# Patient Record
Sex: Male | Born: 1945 | Race: Black or African American | Hispanic: No | Marital: Married | Smoking: Never smoker
Health system: Southern US, Community
[De-identification: ages and names within clinical notes are randomized; demographics above are authoritative.]

## PROBLEM LIST (undated history)

## (undated) DIAGNOSIS — I82409 Acute embolism and thrombosis of unspecified deep veins of unspecified lower extremity: Secondary | ICD-10-CM

## (undated) DIAGNOSIS — I2699 Other pulmonary embolism without acute cor pulmonale: Secondary | ICD-10-CM

## (undated) DIAGNOSIS — I10 Essential (primary) hypertension: Secondary | ICD-10-CM

---

## 2004-04-29 ENCOUNTER — Ambulatory Visit (HOSPITAL_COMMUNITY): Admission: RE | Admit: 2004-04-29 | Discharge: 2004-04-29 | Payer: Self-pay

## 2009-02-24 ENCOUNTER — Ambulatory Visit (HOSPITAL_COMMUNITY): Admission: RE | Admit: 2009-02-24 | Discharge: 2009-02-24 | Payer: Self-pay | Admitting: *Deleted

## 2009-06-28 ENCOUNTER — Encounter (INDEPENDENT_AMBULATORY_CARE_PROVIDER_SITE_OTHER): Payer: Self-pay | Admitting: Internal Medicine

## 2009-06-28 ENCOUNTER — Inpatient Hospital Stay (HOSPITAL_COMMUNITY): Admission: EM | Admit: 2009-06-28 | Discharge: 2009-07-03 | Payer: Self-pay | Admitting: Unknown Physician Specialty

## 2009-06-28 ENCOUNTER — Ambulatory Visit: Payer: Self-pay | Admitting: Vascular Surgery

## 2010-06-01 ENCOUNTER — Ambulatory Visit: Payer: Self-pay | Admitting: Vascular Surgery

## 2010-06-01 ENCOUNTER — Encounter (INDEPENDENT_AMBULATORY_CARE_PROVIDER_SITE_OTHER): Payer: Self-pay | Admitting: Internal Medicine

## 2010-06-01 ENCOUNTER — Inpatient Hospital Stay (HOSPITAL_COMMUNITY): Admission: EM | Admit: 2010-06-01 | Discharge: 2010-06-09 | Payer: Self-pay | Admitting: Emergency Medicine

## 2010-10-10 LAB — HEMOGLOBIN A1C
Hgb A1c MFr Bld: 6.4 % — ABNORMAL HIGH (ref <5.7)
Mean Plasma Glucose: 137 mg/dL — ABNORMAL HIGH (ref <117)

## 2010-10-10 LAB — GLUCOSE, CAPILLARY
Glucose-Capillary: 113 mg/dL — ABNORMAL HIGH (ref 70–99)
Glucose-Capillary: 114 mg/dL — ABNORMAL HIGH (ref 70–99)
Glucose-Capillary: 117 mg/dL — ABNORMAL HIGH (ref 70–99)
Glucose-Capillary: 119 mg/dL — ABNORMAL HIGH (ref 70–99)
Glucose-Capillary: 119 mg/dL — ABNORMAL HIGH (ref 70–99)
Glucose-Capillary: 122 mg/dL — ABNORMAL HIGH (ref 70–99)
Glucose-Capillary: 122 mg/dL — ABNORMAL HIGH (ref 70–99)
Glucose-Capillary: 122 mg/dL — ABNORMAL HIGH (ref 70–99)
Glucose-Capillary: 123 mg/dL — ABNORMAL HIGH (ref 70–99)
Glucose-Capillary: 126 mg/dL — ABNORMAL HIGH (ref 70–99)
Glucose-Capillary: 126 mg/dL — ABNORMAL HIGH (ref 70–99)
Glucose-Capillary: 128 mg/dL — ABNORMAL HIGH (ref 70–99)
Glucose-Capillary: 131 mg/dL — ABNORMAL HIGH (ref 70–99)
Glucose-Capillary: 138 mg/dL — ABNORMAL HIGH (ref 70–99)
Glucose-Capillary: 139 mg/dL — ABNORMAL HIGH (ref 70–99)
Glucose-Capillary: 140 mg/dL — ABNORMAL HIGH (ref 70–99)
Glucose-Capillary: 140 mg/dL — ABNORMAL HIGH (ref 70–99)
Glucose-Capillary: 146 mg/dL — ABNORMAL HIGH (ref 70–99)
Glucose-Capillary: 157 mg/dL — ABNORMAL HIGH (ref 70–99)
Glucose-Capillary: 179 mg/dL — ABNORMAL HIGH (ref 70–99)
Glucose-Capillary: 95 mg/dL (ref 70–99)

## 2010-10-10 LAB — CBC
HCT: 37.9 % — ABNORMAL LOW (ref 39.0–52.0)
HCT: 39 % (ref 39.0–52.0)
HCT: 39.3 % (ref 39.0–52.0)
HCT: 40.4 % (ref 39.0–52.0)
HCT: 41 % (ref 39.0–52.0)
HCT: 42.3 % (ref 39.0–52.0)
Hemoglobin: 12.7 g/dL — ABNORMAL LOW (ref 13.0–17.0)
Hemoglobin: 13 g/dL (ref 13.0–17.0)
Hemoglobin: 13 g/dL (ref 13.0–17.0)
Hemoglobin: 13.4 g/dL (ref 13.0–17.0)
Hemoglobin: 13.5 g/dL (ref 13.0–17.0)
Hemoglobin: 13.8 g/dL (ref 13.0–17.0)
Hemoglobin: 14.2 g/dL (ref 13.0–17.0)
Hemoglobin: 14.9 g/dL (ref 13.0–17.0)
MCH: 28.5 pg (ref 26.0–34.0)
MCH: 28.6 pg (ref 26.0–34.0)
MCH: 28.8 pg (ref 26.0–34.0)
MCH: 28.8 pg (ref 26.0–34.0)
MCH: 28.8 pg (ref 26.0–34.0)
MCH: 29.2 pg (ref 26.0–34.0)
MCHC: 33.5 g/dL (ref 30.0–36.0)
MCHC: 33.7 g/dL (ref 30.0–36.0)
MCHC: 34.2 g/dL (ref 30.0–36.0)
MCHC: 34.4 g/dL (ref 30.0–36.0)
MCHC: 34.6 g/dL (ref 30.0–36.0)
MCHC: 35.2 g/dL (ref 30.0–36.0)
MCV: 84 fL (ref 78.0–100.0)
MCV: 84 fL (ref 78.0–100.0)
MCV: 84.2 fL (ref 78.0–100.0)
MCV: 84.5 fL (ref 78.0–100.0)
MCV: 84.7 fL (ref 78.0–100.0)
MCV: 85 fL (ref 78.0–100.0)
MCV: 85 fL (ref 78.0–100.0)
MCV: 85 fL (ref 78.0–100.0)
Platelets: 219 10*3/uL (ref 150–400)
Platelets: 250 10*3/uL (ref 150–400)
Platelets: 260 10*3/uL (ref 150–400)
RBC: 4.51 MIL/uL (ref 4.22–5.81)
RBC: 4.59 MIL/uL (ref 4.22–5.81)
RDW: 13.9 % (ref 11.5–15.5)
RDW: 14.2 % (ref 11.5–15.5)
RDW: 14.3 % (ref 11.5–15.5)
WBC: 5.7 10*3/uL (ref 4.0–10.5)
WBC: 5.8 10*3/uL (ref 4.0–10.5)

## 2010-10-10 LAB — APTT
aPTT: 110 seconds — ABNORMAL HIGH (ref 24–37)
aPTT: 28 seconds (ref 24–37)

## 2010-10-10 LAB — PROTIME-INR
INR: 1.1 (ref 0.00–1.49)
INR: 1.16 (ref 0.00–1.49)
INR: 2.37 — ABNORMAL HIGH (ref 0.00–1.49)
INR: 2.67 — ABNORMAL HIGH (ref 0.00–1.49)
Prothrombin Time: 15 seconds (ref 11.6–15.2)

## 2010-10-10 LAB — CARDIAC PANEL(CRET KIN+CKTOT+MB+TROPI): Troponin I: 0.2 ng/mL — ABNORMAL HIGH (ref 0.00–0.06)

## 2010-10-10 LAB — HEPARIN LEVEL (UNFRACTIONATED)
Heparin Unfractionated: 0.29 IU/mL — ABNORMAL LOW (ref 0.30–0.70)
Heparin Unfractionated: 0.32 IU/mL (ref 0.30–0.70)
Heparin Unfractionated: 0.57 IU/mL (ref 0.30–0.70)
Heparin Unfractionated: 0.71 IU/mL — ABNORMAL HIGH (ref 0.30–0.70)

## 2010-10-10 LAB — BASIC METABOLIC PANEL
CO2: 25 mEq/L (ref 19–32)
GFR calc non Af Amer: 52 mL/min — ABNORMAL LOW (ref >60)
Glucose, Bld: 154 mg/dL — ABNORMAL HIGH (ref 70–99)
Potassium: 4.3 mEq/L (ref 3.5–5.1)
Sodium: 139 mEq/L (ref 135–145)

## 2010-10-10 LAB — POCT CARDIAC MARKERS
CKMB, poc: 1.4 ng/mL (ref 1.0–8.0)
CKMB, poc: 1.8 ng/mL (ref 1.0–8.0)
Myoglobin, poc: 112 ng/mL (ref 12–200)
Troponin i, poc: 0.14 ng/mL — ABNORMAL HIGH (ref 0.00–0.09)
Troponin i, poc: <0.05 ng/mL (ref 0.00–0.09)

## 2010-10-10 LAB — CK TOTAL AND CKMB (NOT AT ARMC): Total CK: 208 U/L (ref 7–232)

## 2010-10-10 LAB — DIFFERENTIAL
Basophils Relative: 0 % (ref 0–1)
Monocytes Absolute: 0.8 10*3/uL (ref 0.1–1.0)
Monocytes Relative: 11 % (ref 3–12)
Neutro Abs: 4.4 10*3/uL (ref 1.7–7.7)

## 2010-10-10 LAB — URINE CULTURE: Culture  Setup Time: 201111031001

## 2010-10-31 LAB — CBC
HCT: 37.6 % — ABNORMAL LOW (ref 39.0–52.0)
HCT: 38.6 % — ABNORMAL LOW (ref 39.0–52.0)
HCT: 38.7 % — ABNORMAL LOW (ref 39.0–52.0)
HCT: 40.9 % (ref 39.0–52.0)
Hemoglobin: 12.9 g/dL — ABNORMAL LOW (ref 13.0–17.0)
Hemoglobin: 13.1 g/dL (ref 13.0–17.0)
MCHC: 33.8 g/dL (ref 30.0–36.0)
MCHC: 34 g/dL (ref 30.0–36.0)
MCV: 89.1 fL (ref 78.0–100.0)
Platelets: 284 10*3/uL (ref 150–400)
Platelets: 324 10*3/uL (ref 150–400)
RBC: 4.35 MIL/uL (ref 4.22–5.81)
RDW: 14.1 % (ref 11.5–15.5)
RDW: 14.6 % (ref 11.5–15.5)
RDW: 14.6 % (ref 11.5–15.5)
RDW: 14.9 % (ref 11.5–15.5)
WBC: 6.5 10*3/uL (ref 4.0–10.5)

## 2010-10-31 LAB — PROTEIN S ACTIVITY: Protein S Activity: 87 % (ref 69–129)

## 2010-10-31 LAB — BASIC METABOLIC PANEL
BUN: 9 mg/dL (ref 6–23)
CO2: 28 mEq/L (ref 19–32)
Calcium: 9.2 mg/dL (ref 8.4–10.5)
Creatinine, Ser: 1.22 mg/dL (ref 0.4–1.5)
GFR calc non Af Amer: >60 mL/min (ref >60)
Glucose, Bld: 89 mg/dL (ref 70–99)

## 2010-10-31 LAB — ANTIPHOSPHOLIPID SYNDROME EVAL, BLD
Anticardiolipin IgA: <10 APL U/mL — ABNORMAL LOW (ref <10)
Anticardiolipin IgM: <3 MPL U/mL — ABNORMAL LOW (ref <10)
DRVVT: 45.9 secs — ABNORMAL HIGH (ref 34.7–40.5)
PTT Lupus Anticoagulant: 53.8 secs — ABNORMAL HIGH (ref 32.0–43.4)
PTTLA Confirmation: 0 secs (ref <8.0)

## 2010-10-31 LAB — PROTEIN C, TOTAL: Protein C, Total: 96 % (ref 70–140)

## 2010-10-31 LAB — CARDIAC PANEL(CRET KIN+CKTOT+MB+TROPI)
CK, MB: 0.9 ng/mL (ref 0.3–4.0)
Relative Index: 1.1 (ref 0.0–2.5)
Total CK: 97 U/L (ref 7–232)

## 2010-10-31 LAB — PROTIME-INR
INR: 1.09 (ref 0.00–1.49)
INR: 1.22 (ref 0.00–1.49)
Prothrombin Time: 14 seconds (ref 11.6–15.2)
Prothrombin Time: 15.3 seconds — ABNORMAL HIGH (ref 11.6–15.2)
Prothrombin Time: 18.9 seconds — ABNORMAL HIGH (ref 11.6–15.2)

## 2010-10-31 LAB — COMPREHENSIVE METABOLIC PANEL
Alkaline Phosphatase: 55 U/L (ref 39–117)
BUN: 10 mg/dL (ref 6–23)
Chloride: 107 mEq/L (ref 96–112)
Glucose, Bld: 103 mg/dL — ABNORMAL HIGH (ref 70–99)
Potassium: 4.1 mEq/L (ref 3.5–5.1)
Total Bilirubin: 0.3 mg/dL (ref 0.3–1.2)
Total Protein: 6.1 g/dL (ref 6.0–8.3)

## 2010-10-31 LAB — GLUCOSE, CAPILLARY: Glucose-Capillary: 141 mg/dL — ABNORMAL HIGH (ref 70–99)

## 2010-10-31 LAB — PROTEIN S, TOTAL: Protein S Ag, Total: 112 % (ref 70–140)

## 2010-10-31 LAB — ANTITHROMBIN III: AntiThromb III Func: 86 % (ref 76–126)

## 2010-11-01 LAB — DIFFERENTIAL
Basophils Absolute: 0 10*3/uL (ref 0.0–0.1)
Basophils Relative: 0 % (ref 0–1)
Eosinophils Absolute: 0.3 10*3/uL (ref 0.0–0.7)
Eosinophils Relative: 5 % (ref 0–5)
Monocytes Absolute: 0.6 10*3/uL (ref 0.1–1.0)
Neutro Abs: 3.4 10*3/uL (ref 1.7–7.7)

## 2010-11-01 LAB — CBC
Hemoglobin: 12.3 g/dL — ABNORMAL LOW (ref 13.0–17.0)
MCHC: 33.8 g/dL (ref 30.0–36.0)
RDW: 14.7 % (ref 11.5–15.5)

## 2010-11-01 LAB — POCT I-STAT, CHEM 8
BUN: 10 mg/dL (ref 6–23)
Calcium, Ion: 1.16 mmol/L (ref 1.12–1.32)
Creatinine, Ser: 1.3 mg/dL (ref 0.4–1.5)
Glucose, Bld: 86 mg/dL (ref 70–99)
TCO2: 22 mmol/L (ref 0–100)

## 2010-11-25 ENCOUNTER — Emergency Department (HOSPITAL_COMMUNITY): Payer: BC Managed Care – PPO

## 2010-11-25 ENCOUNTER — Observation Stay (HOSPITAL_COMMUNITY)
Admission: EM | Admit: 2010-11-25 | Discharge: 2010-11-26 | Disposition: A | Discharge status: Home / Self Care | Payer: BC Managed Care – PPO | Attending: Internal Medicine | Admitting: Internal Medicine

## 2010-11-25 ENCOUNTER — Encounter (HOSPITAL_COMMUNITY): Payer: Self-pay | Admitting: Radiology

## 2010-11-25 DIAGNOSIS — I129 Hypertensive chronic kidney disease with stage 1 through stage 4 chronic kidney disease, or unspecified chronic kidney disease: Secondary | ICD-10-CM | POA: Insufficient documentation

## 2010-11-25 DIAGNOSIS — R0789 Other chest pain: Principal | ICD-10-CM | POA: Insufficient documentation

## 2010-11-25 DIAGNOSIS — Z86718 Personal history of other venous thrombosis and embolism: Secondary | ICD-10-CM | POA: Insufficient documentation

## 2010-11-25 DIAGNOSIS — E119 Type 2 diabetes mellitus without complications: Secondary | ICD-10-CM | POA: Insufficient documentation

## 2010-11-25 DIAGNOSIS — Z7901 Long term (current) use of anticoagulants: Secondary | ICD-10-CM | POA: Insufficient documentation

## 2010-11-25 DIAGNOSIS — N182 Chronic kidney disease, stage 2 (mild): Secondary | ICD-10-CM | POA: Insufficient documentation

## 2010-11-25 DIAGNOSIS — E785 Hyperlipidemia, unspecified: Secondary | ICD-10-CM | POA: Insufficient documentation

## 2010-11-25 LAB — COMPREHENSIVE METABOLIC PANEL
ALT: 31 U/L (ref 0–53)
AST: 21 U/L (ref 0–37)
Alkaline Phosphatase: 52 U/L (ref 39–117)
CO2: 24 mEq/L (ref 19–32)
GFR calc Af Amer: >60 mL/min (ref >60)
Glucose, Bld: 132 mg/dL — ABNORMAL HIGH (ref 70–99)
Potassium: 4 mEq/L (ref 3.5–5.1)
Sodium: 133 mEq/L — ABNORMAL LOW (ref 135–145)
Total Protein: 6.4 g/dL (ref 6.0–8.3)

## 2010-11-25 LAB — DIFFERENTIAL
Basophils Relative: 0 % (ref 0–1)
Eosinophils Absolute: 0.1 10*3/uL (ref 0.0–0.7)
Monocytes Absolute: 0.5 10*3/uL (ref 0.1–1.0)
Monocytes Relative: 10 % (ref 3–12)

## 2010-11-25 LAB — POCT CARDIAC MARKERS
CKMB, poc: 1.4 ng/mL (ref 1.0–8.0)
Troponin i, poc: <0.05 ng/mL (ref 0.00–0.09)

## 2010-11-25 LAB — CBC
MCH: 28.8 pg (ref 26.0–34.0)
MCHC: 34.8 g/dL (ref 30.0–36.0)
MCV: 82.9 fL (ref 78.0–100.0)
Platelets: 208 10*3/uL (ref 150–400)

## 2010-11-25 LAB — PROTIME-INR: Prothrombin Time: 23.7 seconds — ABNORMAL HIGH (ref 11.6–15.2)

## 2010-11-25 MED ORDER — IOHEXOL 300 MG/ML  SOLN
80.0000 mL | Freq: Once | INTRAMUSCULAR | Status: AC | PRN
  Administered 2010-11-25: 80 mL via INTRAVENOUS

## 2010-11-26 LAB — LIPID PANEL
Cholesterol: 115 mg/dL (ref 0–200)
HDL: 38 mg/dL — ABNORMAL LOW (ref >39)
LDL Cholesterol: 66 mg/dL (ref 0–99)
Total CHOL/HDL Ratio: 3 ratio
Triglycerides: 56 mg/dL (ref <150)
VLDL: 11 mg/dL (ref 0–40)

## 2010-11-26 LAB — HEMOGLOBIN A1C
Hgb A1c MFr Bld: 6.7 % — ABNORMAL HIGH (ref <5.7)
Mean Plasma Glucose: 146 mg/dL — ABNORMAL HIGH (ref <117)

## 2010-11-26 LAB — CARDIAC PANEL(CRET KIN+CKTOT+MB+TROPI)
CK, MB: 1.6 ng/mL (ref 0.3–4.0)
CK, MB: 1.5 ng/mL (ref 0.3–4.0)
Relative Index: 0.8 (ref 0.0–2.5)
Relative Index: 0.8 (ref 0.0–2.5)
Total CK: 210 U/L (ref 7–232)
Total CK: 178 U/L (ref 7–232)
Troponin I: 0.01 ng/mL (ref 0.00–0.06)
Troponin I: 0.01 ng/mL (ref 0.00–0.06)

## 2010-11-26 LAB — GLUCOSE, CAPILLARY
Glucose-Capillary: 170 mg/dL — ABNORMAL HIGH (ref 70–99)
Glucose-Capillary: 115 mg/dL — ABNORMAL HIGH (ref 70–99)

## 2010-11-26 LAB — BRAIN NATRIURETIC PEPTIDE: Pro B Natriuretic peptide (BNP): <30 pg/mL (ref 0.0–100.0)

## 2010-11-26 LAB — CK TOTAL AND CKMB (NOT AT ARMC)
CK, MB: 1.8 ng/mL (ref 0.3–4.0)
Relative Index: 0.8 (ref 0.0–2.5)

## 2010-11-26 LAB — TSH: TSH: 1.855 u[IU]/mL (ref 0.350–4.500)

## 2010-11-26 LAB — TROPONIN I: Troponin I: 0.01 ng/mL (ref 0.00–0.06)

## 2010-11-30 NOTE — Discharge Summary (Signed)
NAMESTONY, Chris Burgess             ACCOUNT NO.:  [id number]  MEDICAL RECORD NO.:  [id number]           PATIENT TYPE:  I  LOCATION:  3742                         FACILITY:  [facility]  PHYSICIAN:  Jeoffrey Massed, MD    DATE OF BIRTH:  12-29-1945  DATE OF ADMISSION:  11/25/2010 DATE OF DISCHARGE:                        DISCHARGE SUMMARY - REFERRING   PRIMARY CARE PRACTITIONER:  Juline Patch, M.D.  PRIMARY CARDIOLOGIST:  Dr. Herbie Burgess from [facility].  PRIMARY DISCHARGE DIAGNOSIS:  Atypical chest pain.  SECONDARY DISCHARGE DIAGNOSES: 1. History of recurrent venous thromboembolism, secondary to     hypercoagulable state, requiring a lifelong anticoagulation. 2. Type 2 diabetes. 3. Hypertension. 4. Dyslipidemia.  DISCHARGE MEDICATIONS: 1. Aspirin 81 mg 1 tablet daily. 2. Allopurinol 300 mg 1 tablet p.o. daily. 3. Amlodipine 10 mg 1 tablet p.o. daily. 4. Coumadin 7.5 mg one and half tablets on Mondays, Wednesdays, and     Fridays and 1 tablet on Tuesdays, Thursday, Saturdays, and Sundays. 5. Diovan 320 mg 1 tablet p.o. daily. 6. Lipitor 20 mg 1 tablet p.o. daily. 7. Metformin 750 mg 1 tablet p.o. daily. 8. Nifedipine XR 60 mg 1 tablet daily. 9. Protonix 40 mg 1 tablet p.o. daily.  CONSULTATIONS:  Dr. Royann Shivers from [facility].  BRIEF HISTORY OF PRESENT ILLNESS:  The patient is a very pleasant 65- year-old black male with a history of recurrent venous thromboembolism, secondary to underlying hypercoagulable state, on lifelong anticoagulation with Coumadin, who came into the ED with pleuritic chest pain.  He was then admitted to the hospitalist service.  For further details, please see the history and physical that was dictated by Dr. Wolfgang Burgess on admission.  PERTINENT LABORATORY DATA: 1. Cardiac enzymes were cycled and these were negative. 2. INR on admission was 2.10. 3. BNP was less than 30. 4. HbA1c is 6.7. 5. TSH is  1.855.  RADIOLOGICAL STUDIES:  CT angiogram of the chest done on November 25, 2010 showed no recurrent pulmonary embolism.  No residual sequelae of previous embolic event.  No active process evident.  BRIEF HOSPITAL COURSE: 1. Chest pain.  This was pleuritic in nature and located along the     left side of his chest.  Because of a significant risk factors and     his history of pulmonary embolism in the past, the patient was     admitted to the hospitalist service.  Cardiac enzymes were cycled.     These were negative.  The patient's INR was therapeutic.  However,     given his underlying hypercoagulable state, a repeat CT angiogram     of his chest was done which did not show any new pulmonary     embolism.  The patient does follow up as an outpatient with Dr.     Herbie Burgess from [facility].  As a result,     [facility] were consulted, and they felt that     this patient is stable enough to be discharged home, and their     recommendations are to do an outpatient nuclear stress test.  They  have also recommended that the patient follow up with Dr. Herbie Burgess.     They also note that their office will call the patient to schedule     the stress test as well as a follow-up appointment with Dr. Herbie Burgess     on Monday.  They also recommend that the patient's INR be kept     between 2.5 to 3.5 as well.  As a result, the patient will get an     extra 5 mg of Coumadin today, and he will continue on his usual     dosing of Coumadin, and he will follow up with his primary care     practitioner on March 02 for an INR check.  Again, it is to be     noted that cardiology is recommending that his INR be between 2.5     to 3.5.  Rest of his chronic medical issues were stable.  DISPOSITION:  The patient will be discharged home.  FOLLOW-UP INSTRUCTIONS: 1. The patient to follow up with his primary care practitioner within     1 week upon discharge.  He is to  call for an appointment. 2. The patient has a previously scheduled appointment for an INR     checked on May 02.  He is to keep that appointment, please note     that Cardiology recommendations are to keep the INR between 2.5 to     3.5. 3. [facility] will contact this patient regarding     scheduling a nuclear stress test as an outpatient and to follow up     with Dr. Herbie Burgess.  Total time spent coordinating discharge is 45 minutes.     Jeoffrey Massed, MD     SG/MEDQ  D:  11/26/2010  T:  11/26/2010  Job:  [id number]  cc:   Juline Patch, M.D. [name], MD, MS  Electronically Signed by Jeoffrey Massed  on 11/30/2010 03:40:34 PM

## 2010-12-12 NOTE — Op Note (Signed)
NAMENOAL, ABSHIER             ACCOUNT NO.:  [id number]   MEDICAL RECORD NO.:  [id number]          PATIENT TYPE:  AMB   LOCATION:  ENDO                         FACILITY:  [facility]   PHYSICIAN:  Georgiana Spinner, M.D.    DATE OF BIRTH:  [birth year]   DATE OF PROCEDURE:  DATE OF DISCHARGE:                               OPERATIVE REPORT   PROCEDURE:  Colonoscopy.   INDICATIONS:  Colon cancer screening.   ANESTHESIA:  Fentanyl 75 mcg, Versed 6 mg.   PROCEDURE:  With the patient mildly sedated in the left lateral  decubitus position the Pentax videoscopic colonoscope was inserted in  the rectum.  After a rectal exam was attempted and passed under direct  vision to the cecum, identified by ileocecal valve and appendiceal  orifice there was a pill lodged in the appendiceal orifice, which I  washed away and saw underneath to be clear.  From this point, the  colonoscope was slowly withdrawn taking circumferential views of the  colonic mucosa stopping in the rectum which appeared normal on direct  and showed hemorrhoidal-type tissue on retroflexed view.  The endoscope  was straightened and withdrawn.  The patient's vital signs and pulse  oximeter remained stable.  The patient tolerated the procedure well  without apparent complications.   FINDINGS:  Hemorrhoidal-type tissue.  Otherwise, unremarkable  examination.   PLAN:  Repeat examination 5-10 years as appropriate.           ______________________________  Georgiana Spinner, M.D.     GMO/MEDQ  D:  02/24/2009  T:  02/24/2009  Job:  [id number]

## 2010-12-17 NOTE — H&P (Signed)
Chris Burgess, Chris Burgess             ACCOUNT NO.:  [id number]  MEDICAL RECORD NO.:  [id number]           PATIENT TYPE:  I  LOCATION:  3742                         FACILITY:  [facility]  PHYSICIAN:  Celso Amy, MD   DATE OF BIRTH:  1946/01/14  DATE OF ADMISSION:  11/25/2010 DATE OF DISCHARGE:                             HISTORY & PHYSICAL   CHIEF COMPLAINT:  Chest pain.  HISTORY OF PRESENT ILLNESS:  The patient is a 65 year old African American male with a past medical history of PE, who presented to the ER with a chief complaint of chest pain.  History of present illness dates back to this evening when the patient had onset of chest pain on left side.  Pain was sharp in feeling, it was progressively getting worse and that is the reason the patient presented to the ER.  The patient states the pain gets worse on deep inspiration.  There is no pain when the patient palpates over the area.  It does not increase with exertion. The patient has no complaint of nausea or diaphoresis.  At this time of H and P, the patient does not have any chest pain and is not able to tell that what makes the pain better.  No complaint of dyspnea.  No complaint of bright red blood per rectum.  No complaint of hematemesis or melena.  No complaint of syncope.  No complaint of fever, cough, or chills.  No complaint of recent travel.  The patient also said that he was working in the yard a week ago and was doing more labor than his normal baseline physical activity.  REVIEW OF SYSTEMS:  Positive for tiredness.  ALLERGIES:  The patient has no known drug allergies.  SOCIAL HISTORY:  Nonsmoker and drinks occasionally.  Denies illegal drug abuse.  PAST MEDICAL HISTORY:  Positive for: 1. PE. 2. DVT. 3. The patient is on lifelong Coumadin, has history of 2 Pes. 4. Diabetes mellitus type 2. 5. Hypertension. 6. GERD. 7. Dyslipidemia.  OUTPATIENT MEDICATIONS:  Outpatient medications the patient is on: 1.  Diovan 320 mg p.o. daily. 2. Protonix 40 mg p.o. daily. 3. Coumadin 7.5 mg p.o. daily, the patient takes 1-1/2 tablets on     Monday, Wednesday, and Friday and takes 1 tablet on Tuesday,     Thursday, and Sunday. 4. Lipitor 20 mg p.o. daily. 5. Nifedipine XR 60 mg p.o. daily. 6. Amlodipine 10 mg p.o. daily. 7. Allopurinol 300 mg p.o. daily. 8. Metformin XR 750 mg p.o. daily.  PHYSICAL EXAMINATION:  VITAL SIGNS:  Blood pressure 122/74, pulse 80, respiratory rate 30, temperature is afebrile. GENERAL:  The patient is awake, alert; oriented to time, place, and person; well nourished, lying comfortably. HEENT:  Pupils equally reactive to light and accommodation.  Extraocular movement intact. NECK:  Supple. RESPIRATORY:  No acute respiratory distress. CHEST:  Clear to auscultation bilaterally.  No pain on palpation to the chest pain area. CARDIOVASCULAR:  S1 and S2 is regular rate and rhythm.  No murmurs appreciated. GI:  Obese.  Bowel sounds present, soft, nontender, nondistended. EXTREMITIES:  No lower extremity edema on the right leg,  but there is 1+ edema on the left lower extremity.  This is the same place where the patient has had DVT in the past. CNS:  Cranial nerves II through XII grossly intact.  Motor strength 5/5 in both upper and lower extremities.  No focal motor neurological deficits.  LABORATORY DATA:  Sodium 133, potassium 4.0, serum chloride 104, bicarb 24, BUN 16, serum creatinine 1.3, glucose 132.  AST 21, ALT 31, albumin is 3.7.  The patient's INR is 2.1.  The patient's hemoglobin 12.8, platelet 208.  Troponin 0.05, myoglobin 97.  EKG shows normal sinus rhythm.  No ST-T changes were noted.  The patient had a CT angio which did not show any PE and did not show any acute process.  IMPRESSION: 1. Cardiovascular.  The patient is being admitted with chest pain.     Chest pain is atypical, but the patient has risk factor of being     male, having diabetes and history  of hypertension and dyslipidemia-waarnts further investigation.  2. Respiratory.  The patient has history of pulmonary embolism, is on     Coumadin.  3. Diabetes mellitus type 2.  The patient has history of diabetes     mellitus 2, is on metformin, we will hold for now as the patient     had a CT angio.  4. Hypertension.  The patient's blood pressure is stable.  Out of his     outpatient medications, we will hold amlodipine as the patient is     on both amlodipine and nifedipine.  5. Dyslipidemia.  We will continue outpatient medication.  6. Renal.  The patient has chronic kidney disease stage II and his     creatinine is 1.3.  PLAN: 1. We will admit to telemetry. 2. We will cycle troponins. 3. The patient has atypical chest pain with normal EKG, normal cardiac     enzymes, and on Coumadin.  So, we will not start heparin, but we     will do give aspirin for now. 4. We will continue outpatient medications besides metformin and     amlodipine. 5. We will get a 2-D echo. 6. The patient's further course depends upon how the patient does     during this admission.     Celso Amy, MD    MB/MEDQ  D:  11/25/2010  T:  11/26/2010  Job:  [id number]  Electronically Signed by Celso Amy M.D. on 12/17/2010 02:16:50 PM

## 2012-11-11 IMAGING — CT CT ANGIO CHEST
2 of 6 series · 19 of 36 positions shown · IV contrast (agent unspecified)
Comparison: Chest radiography same day.  Previous CT 06/01/2010

CLINICAL DATA: Left-sided chest pain with inspiration.  History of
DVT and pulmonary emboli.  The patient anticoagulated.

CT ANGIOGRAPHY CHEST WITH CONTRAST
TECHNIQUE: Multidetector CT imaging of the chest was performed
using the standard protocol during bolus administration of
intravenous contrast.  Multiplanar CT image reconstructions
including MIPs were obtained to evaluate the vascular anatomy.
Contrast:  80 ml Amnipaque-CPP

[Series 4: pe · axial · 0.67mm/px · z∈[-226,-18]mm · 18 of 114 slices shown]
[im 5/114  lung]
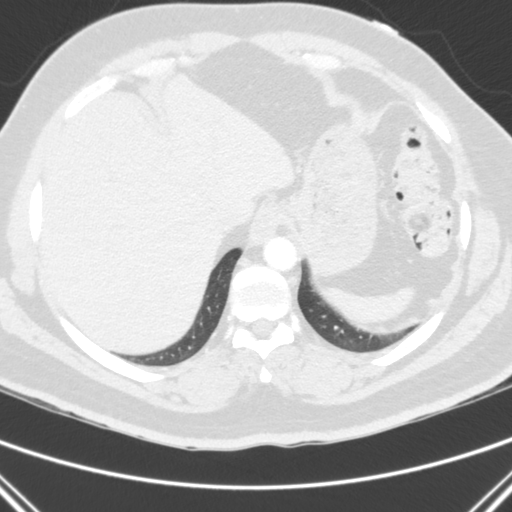
[im 14/114  mediastinal]
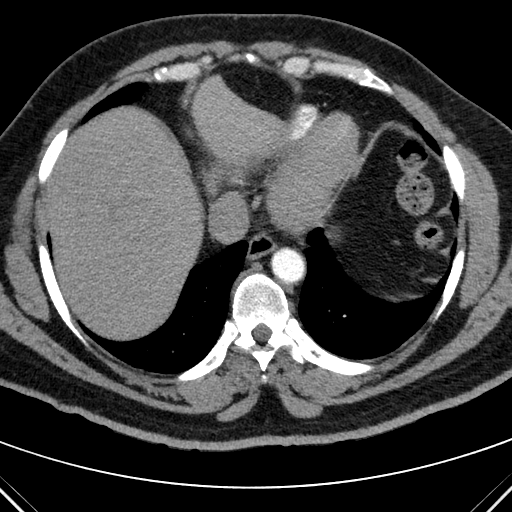
[im 18/114  lung]
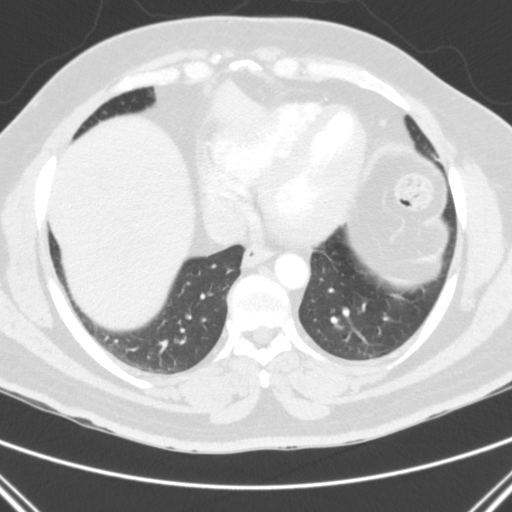
[im 22/114  mediastinal]
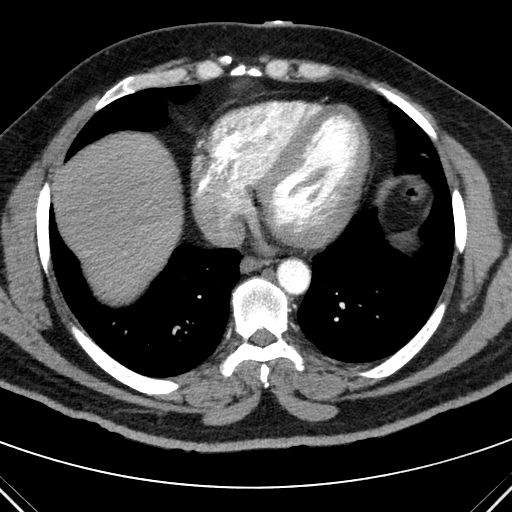
[im 31/114  lung]
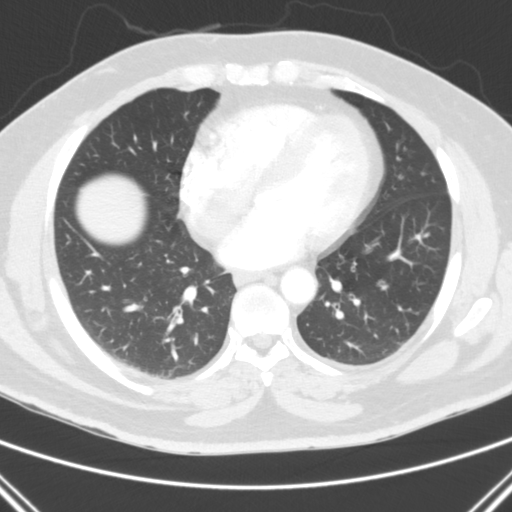
[im 35/114  mediastinal]
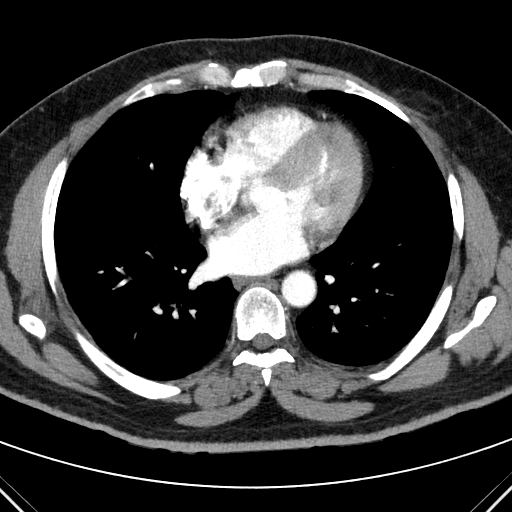
[im 44/114  lung]
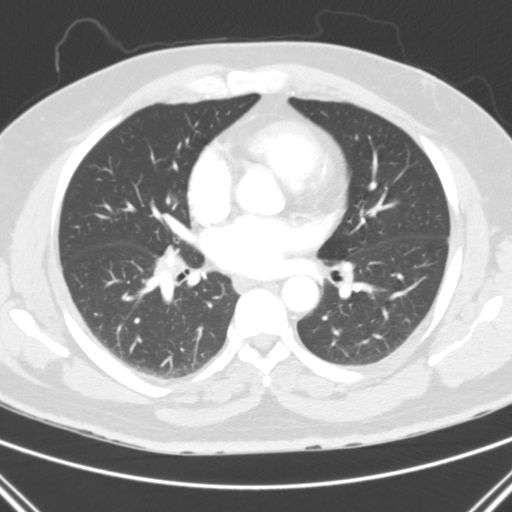
[im 48/114  mediastinal]
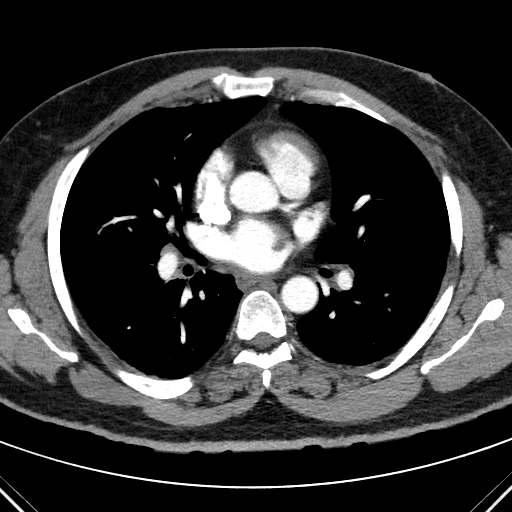
[im 53/114  lung]
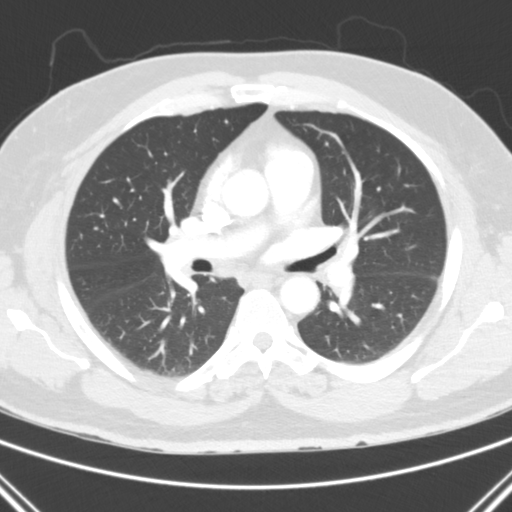
[im 61/114  mediastinal]
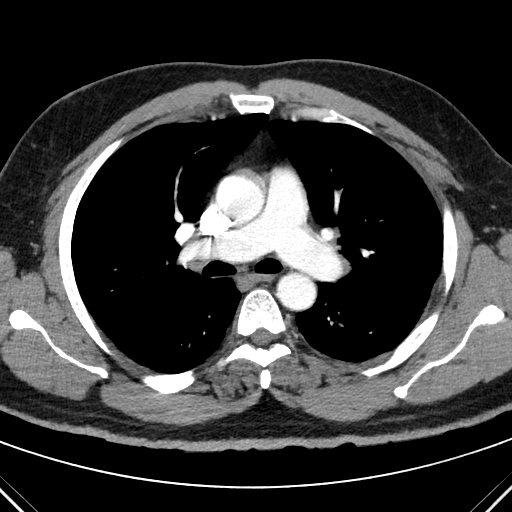
[im 66/114  lung]
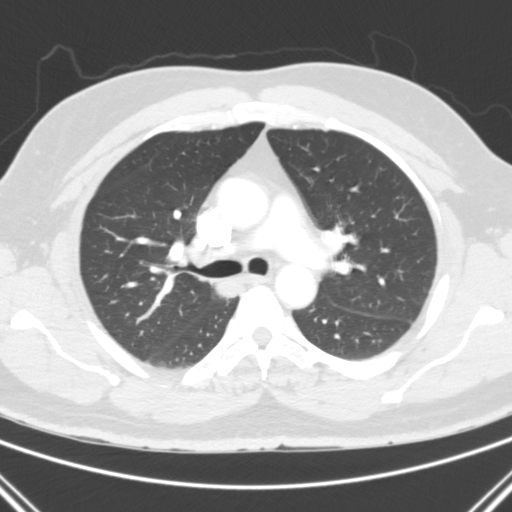
[im 70/114  mediastinal]
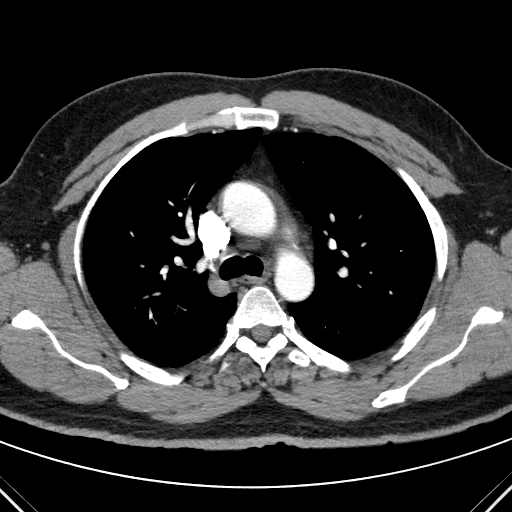
[im 79/114  lung]
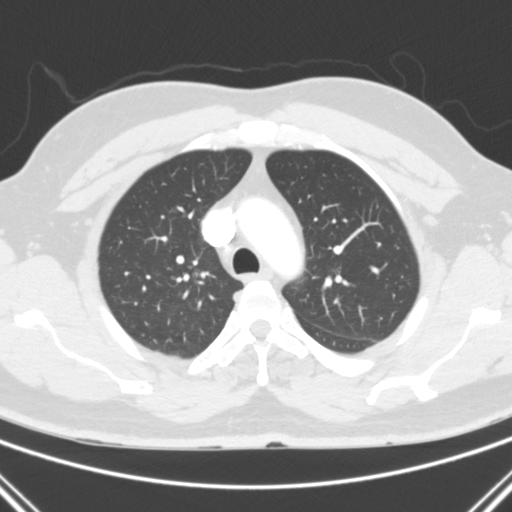
[im 83/114  mediastinal]
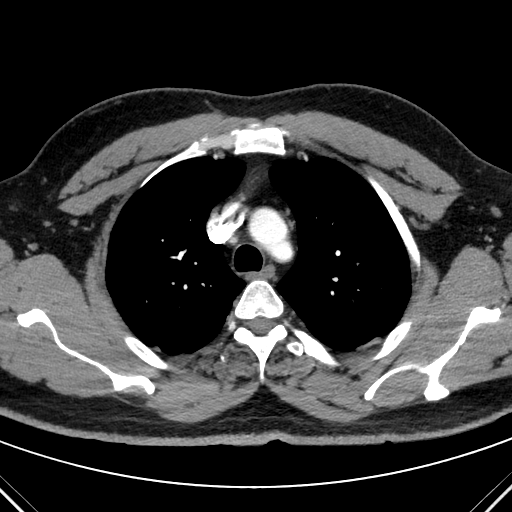
[im 92/114  lung]
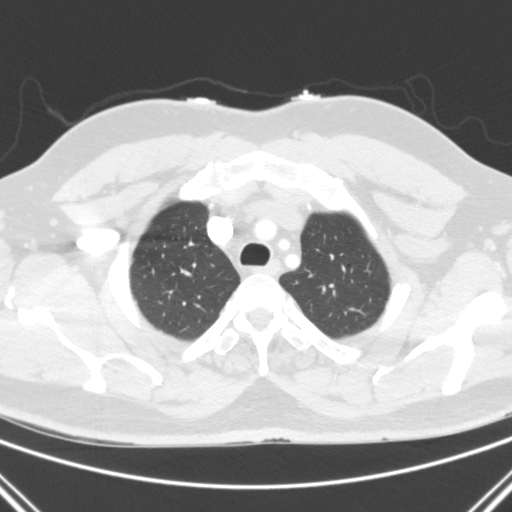
[im 96/114  mediastinal]
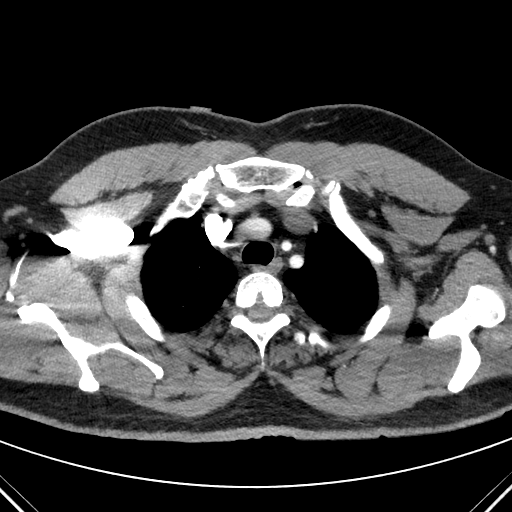
[im 100/114  lung]
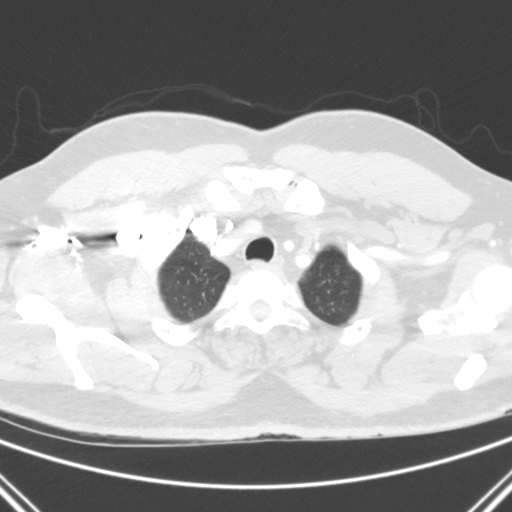
[im 109/114  mediastinal]
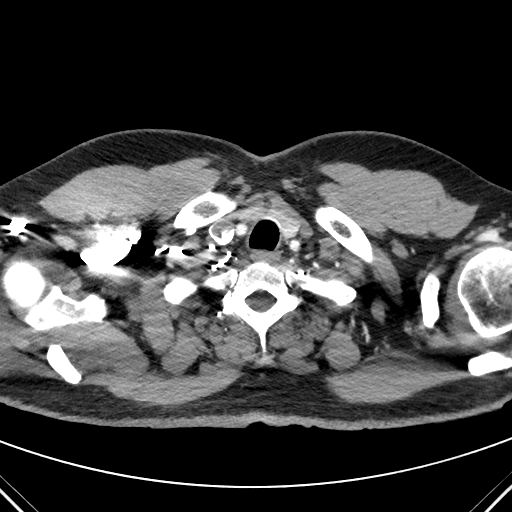

[mpr, coronals, coronal · coronal · 0.67mm/px · 1 of 126 slices shown]
[im 63/126  mediastinal]
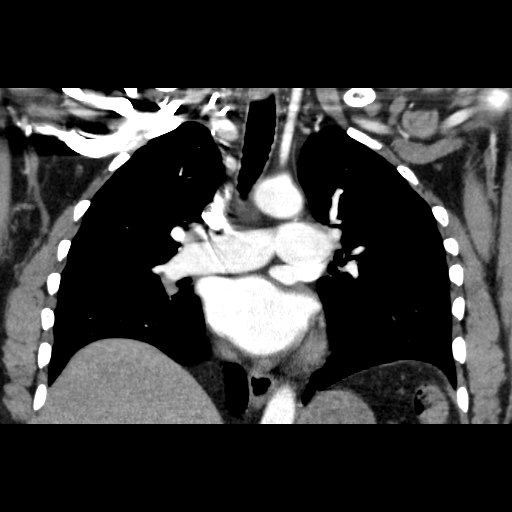

[19 of 36 positions shown; findings below may reference images not displayed]

FINDINGS: Pulmonary arterial opacification is good.  There are no
recurrent pulmonary emboli.  I do not see any evidence of residual
arterial scarring.  No aortic pathology is seen.  No mediastinal or
hilar mass or adenopathy.  There is minimal pulmonary scarring at
the lung bases in the lingula.  No evidence of infiltrate, collapse
or effusion.  Scans in the upper abdomen are unremarkable.

Review of the MIP images confirms the above findings.
IMPRESSION: No recurrent pulmonary emboli.  No residual sequelae of previous
embolic event. No active process evident.

## 2015-08-25 DIAGNOSIS — Z86718 Personal history of other venous thrombosis and embolism: Secondary | ICD-10-CM | POA: Diagnosis not present

## 2015-08-25 DIAGNOSIS — Z7901 Long term (current) use of anticoagulants: Secondary | ICD-10-CM | POA: Diagnosis not present

## 2015-09-22 DIAGNOSIS — Z86718 Personal history of other venous thrombosis and embolism: Secondary | ICD-10-CM | POA: Diagnosis not present

## 2015-09-22 DIAGNOSIS — Z7901 Long term (current) use of anticoagulants: Secondary | ICD-10-CM | POA: Diagnosis not present

## 2015-10-21 ENCOUNTER — Other Ambulatory Visit: Payer: Self-pay | Admitting: Nephrology

## 2015-10-21 DIAGNOSIS — E1022 Type 1 diabetes mellitus with diabetic chronic kidney disease: Secondary | ICD-10-CM

## 2015-10-21 DIAGNOSIS — E785 Hyperlipidemia, unspecified: Secondary | ICD-10-CM | POA: Diagnosis not present

## 2015-10-21 DIAGNOSIS — I129 Hypertensive chronic kidney disease with stage 1 through stage 4 chronic kidney disease, or unspecified chronic kidney disease: Secondary | ICD-10-CM | POA: Diagnosis not present

## 2015-10-21 DIAGNOSIS — N183 Chronic kidney disease, stage 3 (moderate): Secondary | ICD-10-CM | POA: Diagnosis not present

## 2015-10-21 DIAGNOSIS — E1129 Type 2 diabetes mellitus with other diabetic kidney complication: Secondary | ICD-10-CM | POA: Diagnosis not present

## 2015-10-26 ENCOUNTER — Ambulatory Visit
Admission: RE | Admit: 2015-10-26 | Discharge: 2015-10-26 | Disposition: A | Discharge status: Home / Self Care | Payer: Medicare Other | Source: Ambulatory Visit | Attending: Nephrology | Admitting: Nephrology

## 2015-10-26 DIAGNOSIS — E1022 Type 1 diabetes mellitus with diabetic chronic kidney disease: Secondary | ICD-10-CM

## 2015-10-26 DIAGNOSIS — N281 Cyst of kidney, acquired: Secondary | ICD-10-CM | POA: Diagnosis not present

## 2015-10-26 DIAGNOSIS — N183 Chronic kidney disease, stage 3 unspecified: Secondary | ICD-10-CM

## 2015-10-26 DIAGNOSIS — N189 Chronic kidney disease, unspecified: Secondary | ICD-10-CM | POA: Diagnosis not present

## 2015-11-02 DIAGNOSIS — I10 Essential (primary) hypertension: Secondary | ICD-10-CM | POA: Diagnosis not present

## 2015-11-02 DIAGNOSIS — Z125 Encounter for screening for malignant neoplasm of prostate: Secondary | ICD-10-CM | POA: Diagnosis not present

## 2015-11-02 DIAGNOSIS — E119 Type 2 diabetes mellitus without complications: Secondary | ICD-10-CM | POA: Diagnosis not present

## 2015-11-02 DIAGNOSIS — M1000 Idiopathic gout, unspecified site: Secondary | ICD-10-CM | POA: Diagnosis not present

## 2015-11-08 DIAGNOSIS — I10 Essential (primary) hypertension: Secondary | ICD-10-CM | POA: Diagnosis not present

## 2015-11-08 DIAGNOSIS — N183 Chronic kidney disease, stage 3 (moderate): Secondary | ICD-10-CM | POA: Diagnosis not present

## 2015-11-08 DIAGNOSIS — E119 Type 2 diabetes mellitus without complications: Secondary | ICD-10-CM | POA: Diagnosis not present

## 2015-11-08 DIAGNOSIS — I2609 Other pulmonary embolism with acute cor pulmonale: Secondary | ICD-10-CM | POA: Diagnosis not present

## 2015-11-15 DIAGNOSIS — Z7901 Long term (current) use of anticoagulants: Secondary | ICD-10-CM | POA: Diagnosis not present

## 2015-11-15 DIAGNOSIS — Z86718 Personal history of other venous thrombosis and embolism: Secondary | ICD-10-CM | POA: Diagnosis not present

## 2015-12-20 DIAGNOSIS — Z7901 Long term (current) use of anticoagulants: Secondary | ICD-10-CM | POA: Diagnosis not present

## 2015-12-20 DIAGNOSIS — Z86718 Personal history of other venous thrombosis and embolism: Secondary | ICD-10-CM | POA: Diagnosis not present

## 2016-01-23 DIAGNOSIS — Z7901 Long term (current) use of anticoagulants: Secondary | ICD-10-CM | POA: Diagnosis not present

## 2016-01-23 DIAGNOSIS — Z86718 Personal history of other venous thrombosis and embolism: Secondary | ICD-10-CM | POA: Diagnosis not present

## 2016-02-02 DIAGNOSIS — E119 Type 2 diabetes mellitus without complications: Secondary | ICD-10-CM | POA: Diagnosis not present

## 2016-02-02 DIAGNOSIS — I10 Essential (primary) hypertension: Secondary | ICD-10-CM | POA: Diagnosis not present

## 2016-02-21 DIAGNOSIS — Z86718 Personal history of other venous thrombosis and embolism: Secondary | ICD-10-CM | POA: Diagnosis not present

## 2016-02-21 DIAGNOSIS — Z7901 Long term (current) use of anticoagulants: Secondary | ICD-10-CM | POA: Diagnosis not present

## 2016-03-20 DIAGNOSIS — Z7901 Long term (current) use of anticoagulants: Secondary | ICD-10-CM | POA: Diagnosis not present

## 2016-03-20 DIAGNOSIS — Z86718 Personal history of other venous thrombosis and embolism: Secondary | ICD-10-CM | POA: Diagnosis not present

## 2016-04-26 DIAGNOSIS — Z7901 Long term (current) use of anticoagulants: Secondary | ICD-10-CM | POA: Diagnosis not present

## 2016-04-26 DIAGNOSIS — Z86718 Personal history of other venous thrombosis and embolism: Secondary | ICD-10-CM | POA: Diagnosis not present

## 2016-05-24 DIAGNOSIS — Z86718 Personal history of other venous thrombosis and embolism: Secondary | ICD-10-CM | POA: Diagnosis not present

## 2016-05-24 DIAGNOSIS — Z7901 Long term (current) use of anticoagulants: Secondary | ICD-10-CM | POA: Diagnosis not present

## 2016-06-25 DIAGNOSIS — Z7901 Long term (current) use of anticoagulants: Secondary | ICD-10-CM | POA: Diagnosis not present

## 2016-06-25 DIAGNOSIS — Z86718 Personal history of other venous thrombosis and embolism: Secondary | ICD-10-CM | POA: Diagnosis not present

## 2016-07-31 DIAGNOSIS — Z86718 Personal history of other venous thrombosis and embolism: Secondary | ICD-10-CM | POA: Diagnosis not present

## 2016-07-31 DIAGNOSIS — I10 Essential (primary) hypertension: Secondary | ICD-10-CM | POA: Diagnosis not present

## 2016-07-31 DIAGNOSIS — Z7901 Long term (current) use of anticoagulants: Secondary | ICD-10-CM | POA: Diagnosis not present

## 2016-08-06 DIAGNOSIS — E119 Type 2 diabetes mellitus without complications: Secondary | ICD-10-CM | POA: Diagnosis not present

## 2016-08-06 DIAGNOSIS — I10 Essential (primary) hypertension: Secondary | ICD-10-CM | POA: Diagnosis not present

## 2016-08-09 DIAGNOSIS — I10 Essential (primary) hypertension: Secondary | ICD-10-CM | POA: Diagnosis not present

## 2016-08-09 DIAGNOSIS — E119 Type 2 diabetes mellitus without complications: Secondary | ICD-10-CM | POA: Diagnosis not present

## 2016-08-09 DIAGNOSIS — E7800 Pure hypercholesterolemia, unspecified: Secondary | ICD-10-CM | POA: Diagnosis not present

## 2016-09-03 DIAGNOSIS — Z86718 Personal history of other venous thrombosis and embolism: Secondary | ICD-10-CM | POA: Diagnosis not present

## 2016-09-03 DIAGNOSIS — Z7901 Long term (current) use of anticoagulants: Secondary | ICD-10-CM | POA: Diagnosis not present

## 2016-10-01 DIAGNOSIS — Z86718 Personal history of other venous thrombosis and embolism: Secondary | ICD-10-CM | POA: Diagnosis not present

## 2016-10-01 DIAGNOSIS — Z7901 Long term (current) use of anticoagulants: Secondary | ICD-10-CM | POA: Diagnosis not present

## 2016-10-29 DIAGNOSIS — Z86718 Personal history of other venous thrombosis and embolism: Secondary | ICD-10-CM | POA: Diagnosis not present

## 2016-10-29 DIAGNOSIS — Z7901 Long term (current) use of anticoagulants: Secondary | ICD-10-CM | POA: Diagnosis not present

## 2016-11-06 DIAGNOSIS — I129 Hypertensive chronic kidney disease with stage 1 through stage 4 chronic kidney disease, or unspecified chronic kidney disease: Secondary | ICD-10-CM | POA: Diagnosis not present

## 2016-11-06 DIAGNOSIS — N183 Chronic kidney disease, stage 3 (moderate): Secondary | ICD-10-CM | POA: Diagnosis not present

## 2016-11-06 DIAGNOSIS — E1129 Type 2 diabetes mellitus with other diabetic kidney complication: Secondary | ICD-10-CM | POA: Diagnosis not present

## 2016-11-06 DIAGNOSIS — E785 Hyperlipidemia, unspecified: Secondary | ICD-10-CM | POA: Diagnosis not present

## 2016-11-08 DIAGNOSIS — N183 Chronic kidney disease, stage 3 (moderate): Secondary | ICD-10-CM | POA: Diagnosis not present

## 2016-11-26 DIAGNOSIS — Z7901 Long term (current) use of anticoagulants: Secondary | ICD-10-CM | POA: Diagnosis not present

## 2016-11-26 DIAGNOSIS — Z86718 Personal history of other venous thrombosis and embolism: Secondary | ICD-10-CM | POA: Diagnosis not present

## 2016-12-25 DIAGNOSIS — Z86718 Personal history of other venous thrombosis and embolism: Secondary | ICD-10-CM | POA: Diagnosis not present

## 2016-12-25 DIAGNOSIS — Z7901 Long term (current) use of anticoagulants: Secondary | ICD-10-CM | POA: Diagnosis not present

## 2017-01-21 DIAGNOSIS — Z86718 Personal history of other venous thrombosis and embolism: Secondary | ICD-10-CM | POA: Diagnosis not present

## 2017-01-21 DIAGNOSIS — Z7901 Long term (current) use of anticoagulants: Secondary | ICD-10-CM | POA: Diagnosis not present

## 2017-01-31 DIAGNOSIS — Z0000 Encounter for general adult medical examination without abnormal findings: Secondary | ICD-10-CM | POA: Diagnosis not present

## 2017-01-31 DIAGNOSIS — E119 Type 2 diabetes mellitus without complications: Secondary | ICD-10-CM | POA: Diagnosis not present

## 2017-01-31 DIAGNOSIS — Z125 Encounter for screening for malignant neoplasm of prostate: Secondary | ICD-10-CM | POA: Diagnosis not present

## 2017-01-31 DIAGNOSIS — I10 Essential (primary) hypertension: Secondary | ICD-10-CM | POA: Diagnosis not present

## 2017-02-05 DIAGNOSIS — Z86718 Personal history of other venous thrombosis and embolism: Secondary | ICD-10-CM | POA: Diagnosis not present

## 2017-02-05 DIAGNOSIS — Z7901 Long term (current) use of anticoagulants: Secondary | ICD-10-CM | POA: Diagnosis not present

## 2017-02-07 DIAGNOSIS — E119 Type 2 diabetes mellitus without complications: Secondary | ICD-10-CM | POA: Diagnosis not present

## 2017-02-07 DIAGNOSIS — Z23 Encounter for immunization: Secondary | ICD-10-CM | POA: Diagnosis not present

## 2017-02-07 DIAGNOSIS — Z0000 Encounter for general adult medical examination without abnormal findings: Secondary | ICD-10-CM | POA: Diagnosis not present

## 2017-02-07 DIAGNOSIS — E7800 Pure hypercholesterolemia, unspecified: Secondary | ICD-10-CM | POA: Diagnosis not present

## 2017-02-07 DIAGNOSIS — I10 Essential (primary) hypertension: Secondary | ICD-10-CM | POA: Diagnosis not present

## 2017-03-05 DIAGNOSIS — Z7901 Long term (current) use of anticoagulants: Secondary | ICD-10-CM | POA: Diagnosis not present

## 2017-03-05 DIAGNOSIS — Z86718 Personal history of other venous thrombosis and embolism: Secondary | ICD-10-CM | POA: Diagnosis not present

## 2017-04-16 DIAGNOSIS — Z86718 Personal history of other venous thrombosis and embolism: Secondary | ICD-10-CM | POA: Diagnosis not present

## 2017-04-16 DIAGNOSIS — Z7901 Long term (current) use of anticoagulants: Secondary | ICD-10-CM | POA: Diagnosis not present

## 2017-04-25 DIAGNOSIS — E119 Type 2 diabetes mellitus without complications: Secondary | ICD-10-CM | POA: Diagnosis not present

## 2017-04-30 DIAGNOSIS — Z86718 Personal history of other venous thrombosis and embolism: Secondary | ICD-10-CM | POA: Diagnosis not present

## 2017-04-30 DIAGNOSIS — Z7901 Long term (current) use of anticoagulants: Secondary | ICD-10-CM | POA: Diagnosis not present

## 2017-05-28 DIAGNOSIS — Z86718 Personal history of other venous thrombosis and embolism: Secondary | ICD-10-CM | POA: Diagnosis not present

## 2017-05-28 DIAGNOSIS — Z7901 Long term (current) use of anticoagulants: Secondary | ICD-10-CM | POA: Diagnosis not present

## 2017-07-02 DIAGNOSIS — Z86718 Personal history of other venous thrombosis and embolism: Secondary | ICD-10-CM | POA: Diagnosis not present

## 2017-07-02 DIAGNOSIS — E559 Vitamin D deficiency, unspecified: Secondary | ICD-10-CM | POA: Diagnosis not present

## 2017-07-02 DIAGNOSIS — Z7901 Long term (current) use of anticoagulants: Secondary | ICD-10-CM | POA: Diagnosis not present

## 2017-07-31 DIAGNOSIS — Z86718 Personal history of other venous thrombosis and embolism: Secondary | ICD-10-CM | POA: Diagnosis not present

## 2017-07-31 DIAGNOSIS — I10 Essential (primary) hypertension: Secondary | ICD-10-CM | POA: Diagnosis not present

## 2017-07-31 DIAGNOSIS — M109 Gout, unspecified: Secondary | ICD-10-CM | POA: Diagnosis not present

## 2017-07-31 DIAGNOSIS — E119 Type 2 diabetes mellitus without complications: Secondary | ICD-10-CM | POA: Diagnosis not present

## 2017-07-31 DIAGNOSIS — Z7901 Long term (current) use of anticoagulants: Secondary | ICD-10-CM | POA: Diagnosis not present

## 2017-07-31 DIAGNOSIS — E559 Vitamin D deficiency, unspecified: Secondary | ICD-10-CM | POA: Diagnosis not present

## 2017-08-09 DIAGNOSIS — Z125 Encounter for screening for malignant neoplasm of prostate: Secondary | ICD-10-CM | POA: Diagnosis not present

## 2017-08-09 DIAGNOSIS — I10 Essential (primary) hypertension: Secondary | ICD-10-CM | POA: Diagnosis not present

## 2017-08-09 DIAGNOSIS — E119 Type 2 diabetes mellitus without complications: Secondary | ICD-10-CM | POA: Diagnosis not present

## 2017-08-09 DIAGNOSIS — E7800 Pure hypercholesterolemia, unspecified: Secondary | ICD-10-CM | POA: Diagnosis not present

## 2017-08-09 DIAGNOSIS — E559 Vitamin D deficiency, unspecified: Secondary | ICD-10-CM | POA: Diagnosis not present

## 2017-08-28 DIAGNOSIS — Z86718 Personal history of other venous thrombosis and embolism: Secondary | ICD-10-CM | POA: Diagnosis not present

## 2017-08-28 DIAGNOSIS — Z7901 Long term (current) use of anticoagulants: Secondary | ICD-10-CM | POA: Diagnosis not present

## 2017-09-26 DIAGNOSIS — Z7901 Long term (current) use of anticoagulants: Secondary | ICD-10-CM | POA: Diagnosis not present

## 2017-09-26 DIAGNOSIS — Z86718 Personal history of other venous thrombosis and embolism: Secondary | ICD-10-CM | POA: Diagnosis not present

## 2017-10-12 IMAGING — US US RENAL
1 series · 14 of 25 positions shown · non-contrast
Comparison: CT 11/25/2010.

CLINICAL DATA: Chronic renal disease.

EXAM:
RENAL / URINARY TRACT ULTRASOUND COMPLETE

[Series 1: us renal · 0.26mm/px · 14 of 30 slices shown]
[im 1/30]
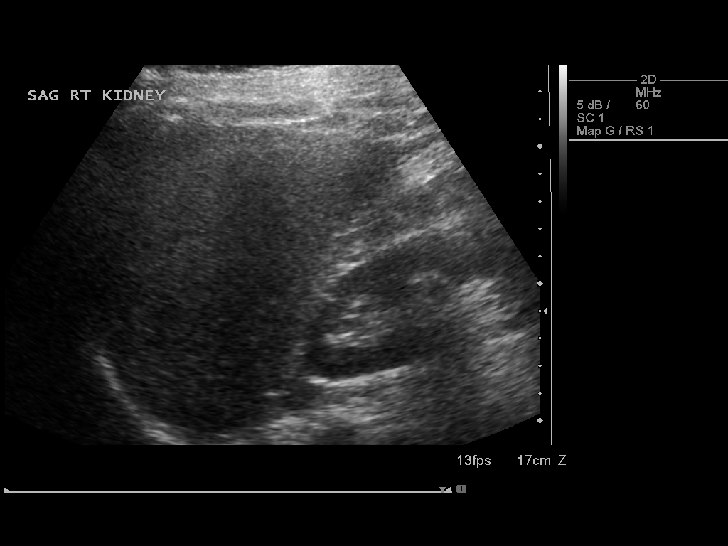
[im 3/30]
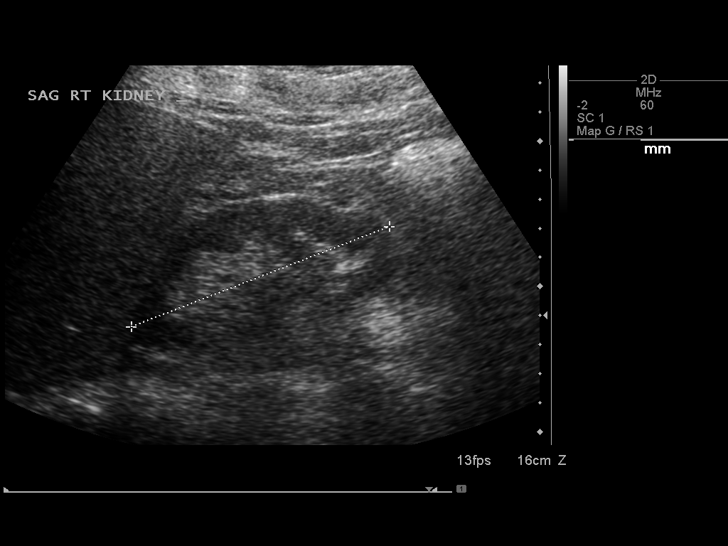
[im 5/30]
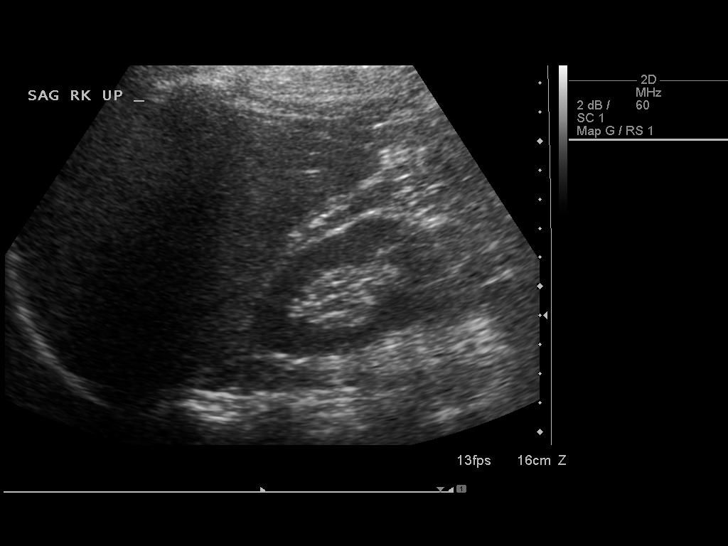
[im 8/30]
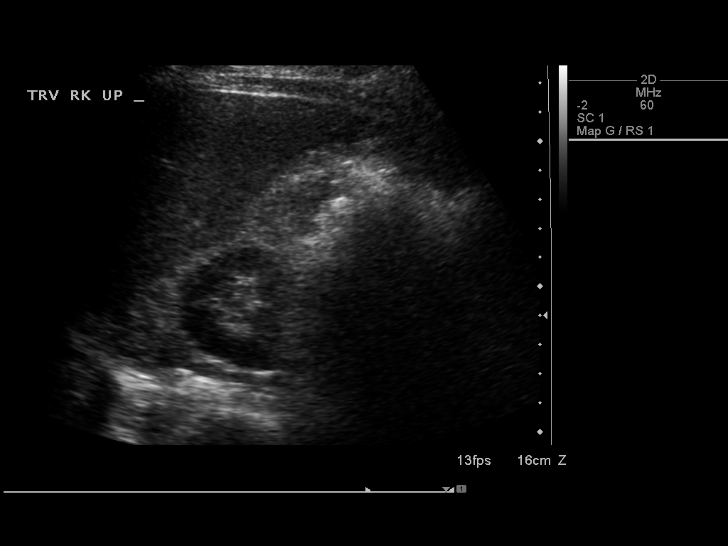
[im 10/30]
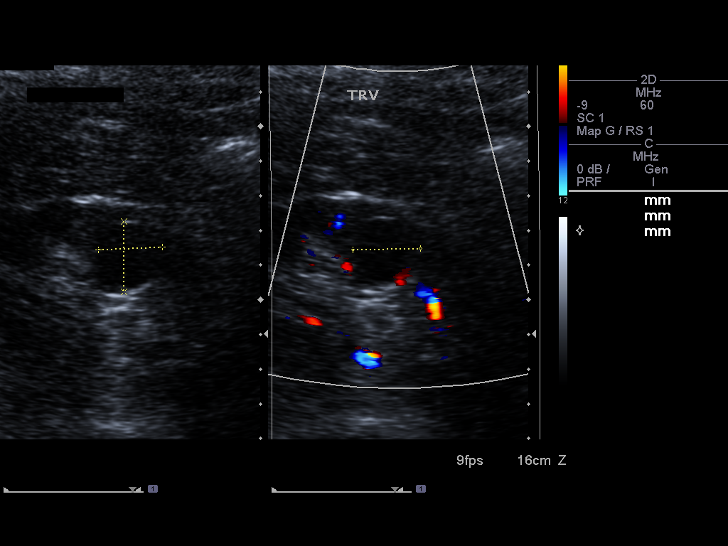
[im 11/30]
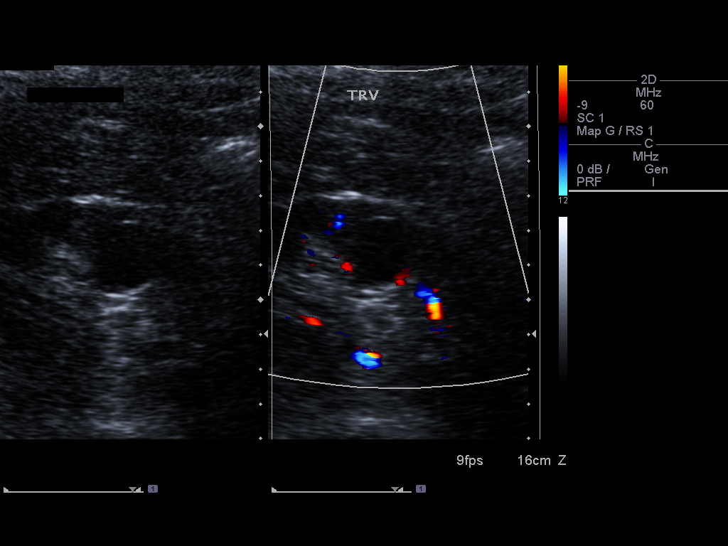
[im 14/30]
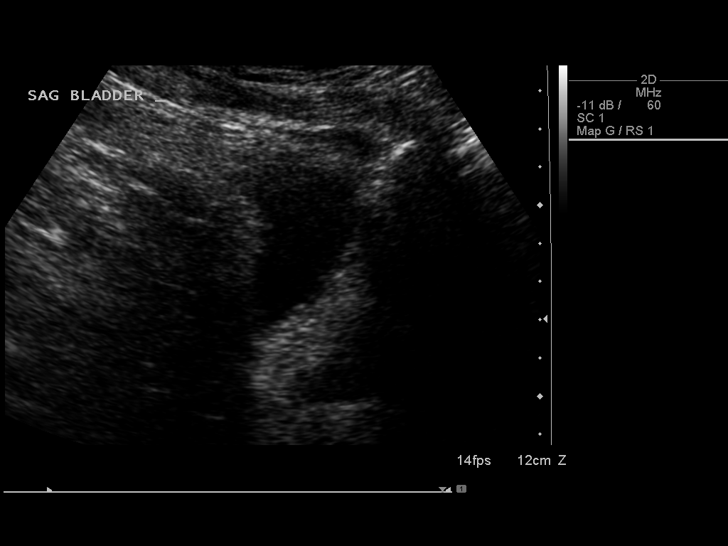
[im 16/30]
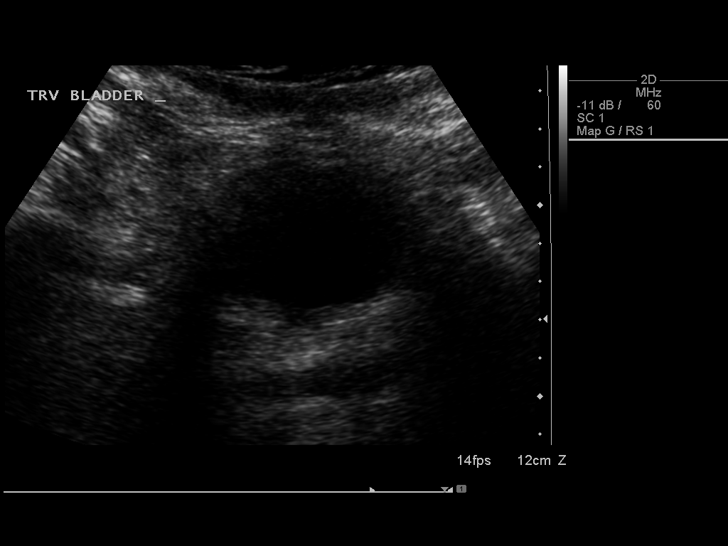
[im 19/30]
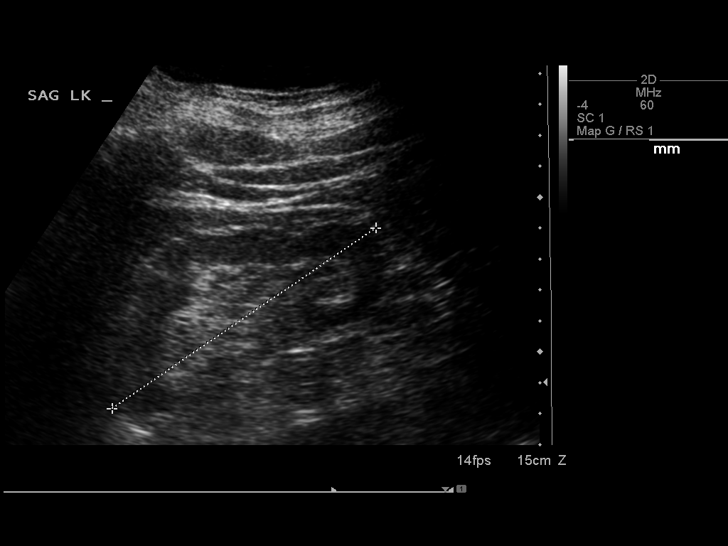
[im 20/30]
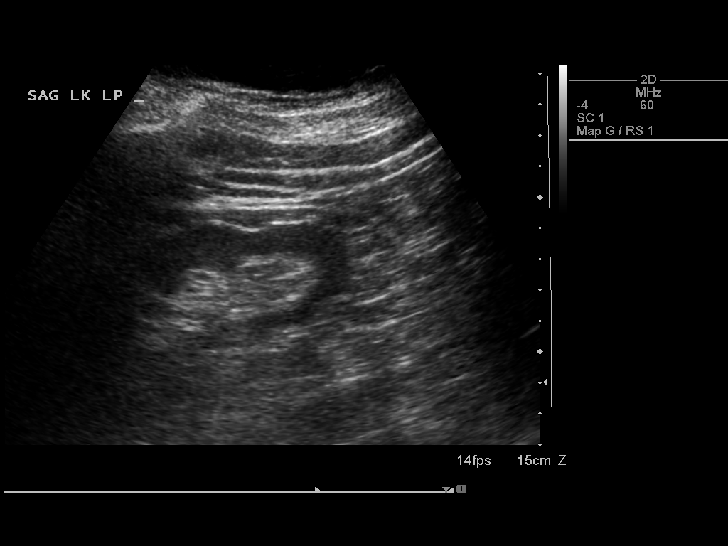
[im 22/30]
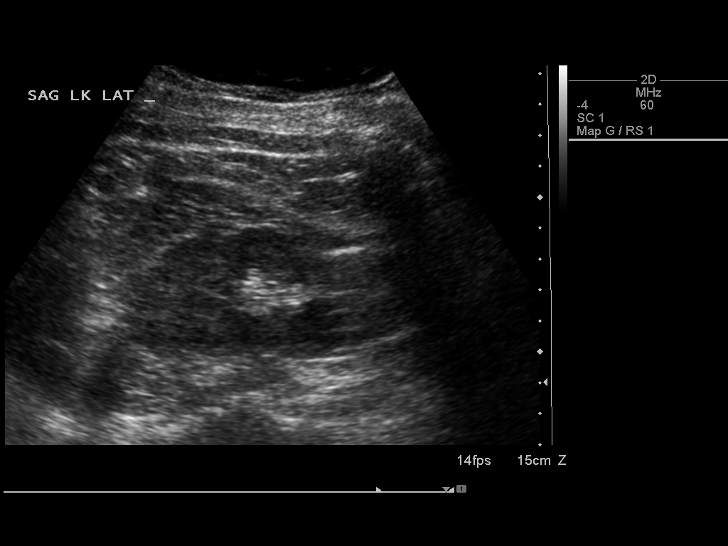
[im 25/30]
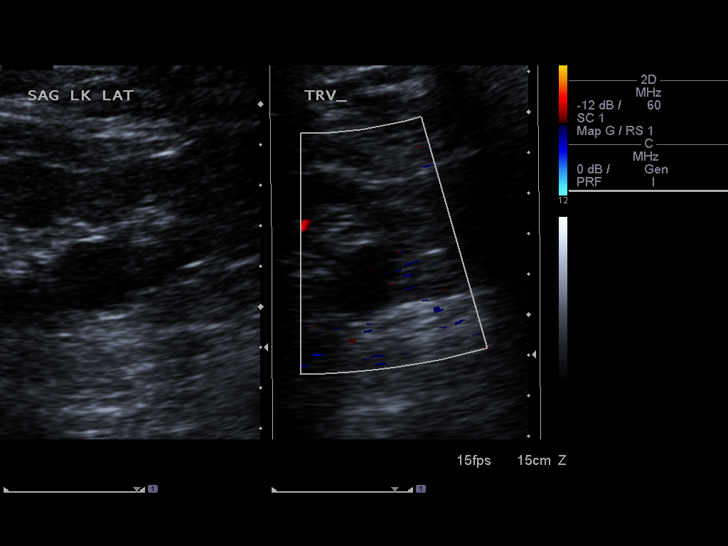
[im 27/30]
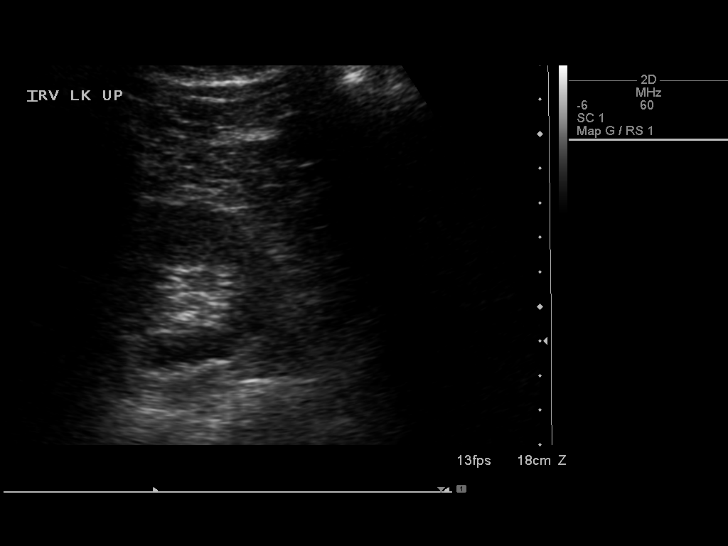
[im 30/30]
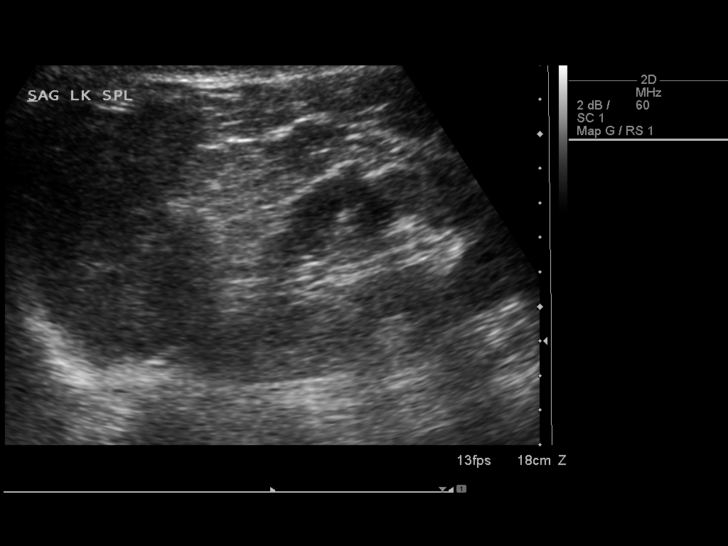

[14 of 25 positions shown; findings below may reference images not displayed]

FINDINGS: Right Kidney:

Length: 10.0 cm. Cortical thinning with changes of scarring.
Echogenicity within normal limits. No mass or hydronephrosis
visualized. 2.0 cm simple cyst lower pole.

Left Kidney:

Length: 10.9 cm. Cortical thinning with changes of scarring.
Echogenicity within normal limits. No mass or hydronephrosis
visualized. 1.9 cm simple cyst lower pole.

Bladder:

Appears normal for degree of bladder distention.
IMPRESSION: Bilateral renal cortical thinning and scarring. Bilateral simple
renal cysts. No acute abnormality. No evidence of hydronephrosis.
Bladder nondistended.

## 2017-10-24 DIAGNOSIS — Z7901 Long term (current) use of anticoagulants: Secondary | ICD-10-CM | POA: Diagnosis not present

## 2017-10-24 DIAGNOSIS — Z86718 Personal history of other venous thrombosis and embolism: Secondary | ICD-10-CM | POA: Diagnosis not present

## 2017-11-20 DIAGNOSIS — M109 Gout, unspecified: Secondary | ICD-10-CM | POA: Diagnosis not present

## 2017-11-20 DIAGNOSIS — I10 Essential (primary) hypertension: Secondary | ICD-10-CM | POA: Diagnosis not present

## 2017-11-20 DIAGNOSIS — E559 Vitamin D deficiency, unspecified: Secondary | ICD-10-CM | POA: Diagnosis not present

## 2017-11-20 DIAGNOSIS — E7800 Pure hypercholesterolemia, unspecified: Secondary | ICD-10-CM | POA: Diagnosis not present

## 2017-11-22 DIAGNOSIS — E785 Hyperlipidemia, unspecified: Secondary | ICD-10-CM | POA: Diagnosis not present

## 2017-11-22 DIAGNOSIS — E1122 Type 2 diabetes mellitus with diabetic chronic kidney disease: Secondary | ICD-10-CM | POA: Diagnosis not present

## 2017-11-22 DIAGNOSIS — N183 Chronic kidney disease, stage 3 (moderate): Secondary | ICD-10-CM | POA: Diagnosis not present

## 2017-11-22 DIAGNOSIS — I129 Hypertensive chronic kidney disease with stage 1 through stage 4 chronic kidney disease, or unspecified chronic kidney disease: Secondary | ICD-10-CM | POA: Diagnosis not present

## 2017-11-22 DIAGNOSIS — E559 Vitamin D deficiency, unspecified: Secondary | ICD-10-CM | POA: Diagnosis not present

## 2017-11-25 DIAGNOSIS — Z7901 Long term (current) use of anticoagulants: Secondary | ICD-10-CM | POA: Diagnosis not present

## 2017-11-25 DIAGNOSIS — Z86718 Personal history of other venous thrombosis and embolism: Secondary | ICD-10-CM | POA: Diagnosis not present

## 2017-12-03 DIAGNOSIS — Z86718 Personal history of other venous thrombosis and embolism: Secondary | ICD-10-CM | POA: Diagnosis not present

## 2017-12-03 DIAGNOSIS — Z7901 Long term (current) use of anticoagulants: Secondary | ICD-10-CM | POA: Diagnosis not present

## 2017-12-09 DIAGNOSIS — I10 Essential (primary) hypertension: Secondary | ICD-10-CM | POA: Diagnosis not present

## 2017-12-09 DIAGNOSIS — E119 Type 2 diabetes mellitus without complications: Secondary | ICD-10-CM | POA: Diagnosis not present

## 2017-12-09 DIAGNOSIS — E7800 Pure hypercholesterolemia, unspecified: Secondary | ICD-10-CM | POA: Diagnosis not present

## 2017-12-17 DIAGNOSIS — Z86718 Personal history of other venous thrombosis and embolism: Secondary | ICD-10-CM | POA: Diagnosis not present

## 2017-12-17 DIAGNOSIS — Z7901 Long term (current) use of anticoagulants: Secondary | ICD-10-CM | POA: Diagnosis not present

## 2018-01-14 DIAGNOSIS — Z86718 Personal history of other venous thrombosis and embolism: Secondary | ICD-10-CM | POA: Diagnosis not present

## 2018-01-14 DIAGNOSIS — Z7901 Long term (current) use of anticoagulants: Secondary | ICD-10-CM | POA: Diagnosis not present

## 2018-02-10 DIAGNOSIS — Z125 Encounter for screening for malignant neoplasm of prostate: Secondary | ICD-10-CM | POA: Diagnosis not present

## 2018-02-10 DIAGNOSIS — Z86718 Personal history of other venous thrombosis and embolism: Secondary | ICD-10-CM | POA: Diagnosis not present

## 2018-02-10 DIAGNOSIS — E559 Vitamin D deficiency, unspecified: Secondary | ICD-10-CM | POA: Diagnosis not present

## 2018-02-10 DIAGNOSIS — E119 Type 2 diabetes mellitus without complications: Secondary | ICD-10-CM | POA: Diagnosis not present

## 2018-02-10 DIAGNOSIS — Z7901 Long term (current) use of anticoagulants: Secondary | ICD-10-CM | POA: Diagnosis not present

## 2018-02-10 DIAGNOSIS — I10 Essential (primary) hypertension: Secondary | ICD-10-CM | POA: Diagnosis not present

## 2018-02-17 DIAGNOSIS — E119 Type 2 diabetes mellitus without complications: Secondary | ICD-10-CM | POA: Diagnosis not present

## 2018-02-17 DIAGNOSIS — N289 Disorder of kidney and ureter, unspecified: Secondary | ICD-10-CM | POA: Diagnosis not present

## 2018-02-17 DIAGNOSIS — S20361A Insect bite (nonvenomous) of right front wall of thorax, initial encounter: Secondary | ICD-10-CM | POA: Diagnosis not present

## 2018-02-17 DIAGNOSIS — Z0000 Encounter for general adult medical examination without abnormal findings: Secondary | ICD-10-CM | POA: Diagnosis not present

## 2018-02-17 DIAGNOSIS — I10 Essential (primary) hypertension: Secondary | ICD-10-CM | POA: Diagnosis not present

## 2018-03-05 DIAGNOSIS — I2699 Other pulmonary embolism without acute cor pulmonale: Secondary | ICD-10-CM | POA: Diagnosis not present

## 2018-03-05 DIAGNOSIS — K219 Gastro-esophageal reflux disease without esophagitis: Secondary | ICD-10-CM | POA: Diagnosis not present

## 2018-03-05 DIAGNOSIS — Z1211 Encounter for screening for malignant neoplasm of colon: Secondary | ICD-10-CM | POA: Diagnosis not present

## 2018-03-10 DIAGNOSIS — Z7901 Long term (current) use of anticoagulants: Secondary | ICD-10-CM | POA: Diagnosis not present

## 2018-03-10 DIAGNOSIS — Z86718 Personal history of other venous thrombosis and embolism: Secondary | ICD-10-CM | POA: Diagnosis not present

## 2018-03-19 DIAGNOSIS — R12 Heartburn: Secondary | ICD-10-CM | POA: Diagnosis not present

## 2018-03-19 DIAGNOSIS — R0789 Other chest pain: Secondary | ICD-10-CM | POA: Diagnosis not present

## 2018-03-19 DIAGNOSIS — W19XXXA Unspecified fall, initial encounter: Secondary | ICD-10-CM | POA: Diagnosis not present

## 2018-04-07 DIAGNOSIS — Z7901 Long term (current) use of anticoagulants: Secondary | ICD-10-CM | POA: Diagnosis not present

## 2018-04-07 DIAGNOSIS — Z86718 Personal history of other venous thrombosis and embolism: Secondary | ICD-10-CM | POA: Diagnosis not present

## 2018-04-28 DIAGNOSIS — Z7901 Long term (current) use of anticoagulants: Secondary | ICD-10-CM | POA: Diagnosis not present

## 2018-04-28 DIAGNOSIS — Z86718 Personal history of other venous thrombosis and embolism: Secondary | ICD-10-CM | POA: Diagnosis not present

## 2018-05-12 DIAGNOSIS — E119 Type 2 diabetes mellitus without complications: Secondary | ICD-10-CM | POA: Diagnosis not present

## 2018-05-12 DIAGNOSIS — I10 Essential (primary) hypertension: Secondary | ICD-10-CM | POA: Diagnosis not present

## 2018-05-20 DIAGNOSIS — Z13820 Encounter for screening for osteoporosis: Secondary | ICD-10-CM | POA: Diagnosis not present

## 2018-05-20 DIAGNOSIS — N289 Disorder of kidney and ureter, unspecified: Secondary | ICD-10-CM | POA: Diagnosis not present

## 2018-05-20 DIAGNOSIS — E7800 Pure hypercholesterolemia, unspecified: Secondary | ICD-10-CM | POA: Diagnosis not present

## 2018-05-20 DIAGNOSIS — E559 Vitamin D deficiency, unspecified: Secondary | ICD-10-CM | POA: Diagnosis not present

## 2018-05-20 DIAGNOSIS — Z7901 Long term (current) use of anticoagulants: Secondary | ICD-10-CM | POA: Diagnosis not present

## 2018-05-20 DIAGNOSIS — Z86718 Personal history of other venous thrombosis and embolism: Secondary | ICD-10-CM | POA: Diagnosis not present

## 2018-05-20 DIAGNOSIS — I10 Essential (primary) hypertension: Secondary | ICD-10-CM | POA: Diagnosis not present

## 2018-05-26 DIAGNOSIS — Z86718 Personal history of other venous thrombosis and embolism: Secondary | ICD-10-CM | POA: Diagnosis not present

## 2018-05-26 DIAGNOSIS — Z7901 Long term (current) use of anticoagulants: Secondary | ICD-10-CM | POA: Diagnosis not present

## 2018-06-09 DIAGNOSIS — Z86718 Personal history of other venous thrombosis and embolism: Secondary | ICD-10-CM | POA: Diagnosis not present

## 2018-06-09 DIAGNOSIS — Z7901 Long term (current) use of anticoagulants: Secondary | ICD-10-CM | POA: Diagnosis not present

## 2018-07-07 DIAGNOSIS — Z7901 Long term (current) use of anticoagulants: Secondary | ICD-10-CM | POA: Diagnosis not present

## 2018-07-07 DIAGNOSIS — Z86718 Personal history of other venous thrombosis and embolism: Secondary | ICD-10-CM | POA: Diagnosis not present

## 2018-08-07 DIAGNOSIS — Z86718 Personal history of other venous thrombosis and embolism: Secondary | ICD-10-CM | POA: Diagnosis not present

## 2018-08-07 DIAGNOSIS — Z7901 Long term (current) use of anticoagulants: Secondary | ICD-10-CM | POA: Diagnosis not present

## 2018-09-09 DIAGNOSIS — Z7901 Long term (current) use of anticoagulants: Secondary | ICD-10-CM | POA: Diagnosis not present

## 2018-09-09 DIAGNOSIS — Z86718 Personal history of other venous thrombosis and embolism: Secondary | ICD-10-CM | POA: Diagnosis not present

## 2018-09-15 DIAGNOSIS — M109 Gout, unspecified: Secondary | ICD-10-CM | POA: Diagnosis not present

## 2018-09-15 DIAGNOSIS — Z0000 Encounter for general adult medical examination without abnormal findings: Secondary | ICD-10-CM | POA: Diagnosis not present

## 2018-09-15 DIAGNOSIS — E119 Type 2 diabetes mellitus without complications: Secondary | ICD-10-CM | POA: Diagnosis not present

## 2018-09-22 DIAGNOSIS — N183 Chronic kidney disease, stage 3 (moderate): Secondary | ICD-10-CM | POA: Diagnosis not present

## 2018-09-22 DIAGNOSIS — E1165 Type 2 diabetes mellitus with hyperglycemia: Secondary | ICD-10-CM | POA: Diagnosis not present

## 2018-09-22 DIAGNOSIS — I10 Essential (primary) hypertension: Secondary | ICD-10-CM | POA: Diagnosis not present

## 2018-09-22 DIAGNOSIS — E7800 Pure hypercholesterolemia, unspecified: Secondary | ICD-10-CM | POA: Diagnosis not present

## 2018-09-22 DIAGNOSIS — K219 Gastro-esophageal reflux disease without esophagitis: Secondary | ICD-10-CM | POA: Diagnosis not present

## 2018-10-09 DIAGNOSIS — Z86718 Personal history of other venous thrombosis and embolism: Secondary | ICD-10-CM | POA: Diagnosis not present

## 2018-10-09 DIAGNOSIS — Z7901 Long term (current) use of anticoagulants: Secondary | ICD-10-CM | POA: Diagnosis not present

## 2018-11-13 DIAGNOSIS — I10 Essential (primary) hypertension: Secondary | ICD-10-CM | POA: Diagnosis not present

## 2018-11-13 DIAGNOSIS — Z86718 Personal history of other venous thrombosis and embolism: Secondary | ICD-10-CM | POA: Diagnosis not present

## 2018-11-13 DIAGNOSIS — Z7901 Long term (current) use of anticoagulants: Secondary | ICD-10-CM | POA: Diagnosis not present

## 2018-12-18 DIAGNOSIS — Z86718 Personal history of other venous thrombosis and embolism: Secondary | ICD-10-CM | POA: Diagnosis not present

## 2018-12-18 DIAGNOSIS — Z7901 Long term (current) use of anticoagulants: Secondary | ICD-10-CM | POA: Diagnosis not present

## 2019-01-22 DIAGNOSIS — Z7901 Long term (current) use of anticoagulants: Secondary | ICD-10-CM | POA: Diagnosis not present

## 2019-01-22 DIAGNOSIS — Z86718 Personal history of other venous thrombosis and embolism: Secondary | ICD-10-CM | POA: Diagnosis not present

## 2019-01-22 DIAGNOSIS — I10 Essential (primary) hypertension: Secondary | ICD-10-CM | POA: Diagnosis not present

## 2019-02-17 DIAGNOSIS — E119 Type 2 diabetes mellitus without complications: Secondary | ICD-10-CM | POA: Diagnosis not present

## 2019-02-26 DIAGNOSIS — I10 Essential (primary) hypertension: Secondary | ICD-10-CM | POA: Diagnosis not present

## 2019-02-26 DIAGNOSIS — Z7901 Long term (current) use of anticoagulants: Secondary | ICD-10-CM | POA: Diagnosis not present

## 2019-02-26 DIAGNOSIS — Z86718 Personal history of other venous thrombosis and embolism: Secondary | ICD-10-CM | POA: Diagnosis not present

## 2019-03-16 DIAGNOSIS — I10 Essential (primary) hypertension: Secondary | ICD-10-CM | POA: Diagnosis not present

## 2019-03-25 DIAGNOSIS — E118 Type 2 diabetes mellitus with unspecified complications: Secondary | ICD-10-CM | POA: Diagnosis not present

## 2019-03-25 DIAGNOSIS — Z0000 Encounter for general adult medical examination without abnormal findings: Secondary | ICD-10-CM | POA: Diagnosis not present

## 2019-03-25 DIAGNOSIS — E785 Hyperlipidemia, unspecified: Secondary | ICD-10-CM | POA: Diagnosis not present

## 2019-03-25 DIAGNOSIS — I10 Essential (primary) hypertension: Secondary | ICD-10-CM | POA: Diagnosis not present

## 2019-03-25 DIAGNOSIS — N183 Chronic kidney disease, stage 3 (moderate): Secondary | ICD-10-CM | POA: Diagnosis not present

## 2019-04-02 DIAGNOSIS — Z7901 Long term (current) use of anticoagulants: Secondary | ICD-10-CM | POA: Diagnosis not present

## 2019-04-02 DIAGNOSIS — Z86718 Personal history of other venous thrombosis and embolism: Secondary | ICD-10-CM | POA: Diagnosis not present

## 2019-04-02 DIAGNOSIS — E1165 Type 2 diabetes mellitus with hyperglycemia: Secondary | ICD-10-CM | POA: Diagnosis not present

## 2019-04-02 DIAGNOSIS — I10 Essential (primary) hypertension: Secondary | ICD-10-CM | POA: Diagnosis not present

## 2019-05-07 DIAGNOSIS — Z86718 Personal history of other venous thrombosis and embolism: Secondary | ICD-10-CM | POA: Diagnosis not present

## 2019-05-07 DIAGNOSIS — E1165 Type 2 diabetes mellitus with hyperglycemia: Secondary | ICD-10-CM | POA: Diagnosis not present

## 2019-05-07 DIAGNOSIS — Z7901 Long term (current) use of anticoagulants: Secondary | ICD-10-CM | POA: Diagnosis not present

## 2019-05-07 DIAGNOSIS — I10 Essential (primary) hypertension: Secondary | ICD-10-CM | POA: Diagnosis not present

## 2019-06-11 DIAGNOSIS — Z7901 Long term (current) use of anticoagulants: Secondary | ICD-10-CM | POA: Diagnosis not present

## 2019-06-11 DIAGNOSIS — Z86718 Personal history of other venous thrombosis and embolism: Secondary | ICD-10-CM | POA: Diagnosis not present

## 2019-06-17 DIAGNOSIS — E559 Vitamin D deficiency, unspecified: Secondary | ICD-10-CM | POA: Diagnosis not present

## 2019-06-17 DIAGNOSIS — Z0000 Encounter for general adult medical examination without abnormal findings: Secondary | ICD-10-CM | POA: Diagnosis not present

## 2019-06-17 DIAGNOSIS — E118 Type 2 diabetes mellitus with unspecified complications: Secondary | ICD-10-CM | POA: Diagnosis not present

## 2019-06-17 DIAGNOSIS — I10 Essential (primary) hypertension: Secondary | ICD-10-CM | POA: Diagnosis not present

## 2019-06-24 DIAGNOSIS — I10 Essential (primary) hypertension: Secondary | ICD-10-CM | POA: Diagnosis not present

## 2019-06-24 DIAGNOSIS — E1165 Type 2 diabetes mellitus with hyperglycemia: Secondary | ICD-10-CM | POA: Diagnosis not present

## 2019-06-24 DIAGNOSIS — N183 Chronic kidney disease, stage 3 unspecified: Secondary | ICD-10-CM | POA: Diagnosis not present

## 2019-06-24 DIAGNOSIS — E7800 Pure hypercholesterolemia, unspecified: Secondary | ICD-10-CM | POA: Diagnosis not present

## 2019-07-16 DIAGNOSIS — Z86718 Personal history of other venous thrombosis and embolism: Secondary | ICD-10-CM | POA: Diagnosis not present

## 2019-07-16 DIAGNOSIS — Z7901 Long term (current) use of anticoagulants: Secondary | ICD-10-CM | POA: Diagnosis not present

## 2019-08-21 DIAGNOSIS — Z20822 Contact with and (suspected) exposure to covid-19: Secondary | ICD-10-CM | POA: Diagnosis not present

## 2019-09-10 DIAGNOSIS — Z7901 Long term (current) use of anticoagulants: Secondary | ICD-10-CM | POA: Diagnosis not present

## 2019-09-10 DIAGNOSIS — Z86718 Personal history of other venous thrombosis and embolism: Secondary | ICD-10-CM | POA: Diagnosis not present

## 2019-10-08 DIAGNOSIS — Z86718 Personal history of other venous thrombosis and embolism: Secondary | ICD-10-CM | POA: Diagnosis not present

## 2019-10-08 DIAGNOSIS — Z7901 Long term (current) use of anticoagulants: Secondary | ICD-10-CM | POA: Diagnosis not present

## 2019-10-15 DIAGNOSIS — I10 Essential (primary) hypertension: Secondary | ICD-10-CM | POA: Diagnosis not present

## 2019-10-15 DIAGNOSIS — E7800 Pure hypercholesterolemia, unspecified: Secondary | ICD-10-CM | POA: Diagnosis not present

## 2019-10-22 DIAGNOSIS — E7800 Pure hypercholesterolemia, unspecified: Secondary | ICD-10-CM | POA: Diagnosis not present

## 2019-10-22 DIAGNOSIS — K219 Gastro-esophageal reflux disease without esophagitis: Secondary | ICD-10-CM | POA: Diagnosis not present

## 2019-10-22 DIAGNOSIS — I10 Essential (primary) hypertension: Secondary | ICD-10-CM | POA: Diagnosis not present

## 2019-10-27 DIAGNOSIS — Z86718 Personal history of other venous thrombosis and embolism: Secondary | ICD-10-CM | POA: Diagnosis not present

## 2019-10-27 DIAGNOSIS — Z7901 Long term (current) use of anticoagulants: Secondary | ICD-10-CM | POA: Diagnosis not present

## 2019-11-01 ENCOUNTER — Other Ambulatory Visit: Payer: Self-pay

## 2019-11-01 ENCOUNTER — Ambulatory Visit (HOSPITAL_COMMUNITY)
Admission: EM | Admit: 2019-11-01 | Discharge: 2019-11-01 | Disposition: A | Discharge status: Home / Self Care | Payer: Medicare Other | Source: Home / Self Care

## 2019-11-01 ENCOUNTER — Emergency Department (HOSPITAL_COMMUNITY)
Admission: EM | Admit: 2019-11-01 | Discharge: 2019-11-01 | Disposition: A | Discharge status: Home / Self Care | Payer: Medicare Other | Attending: Emergency Medicine | Admitting: Emergency Medicine

## 2019-11-01 ENCOUNTER — Encounter (HOSPITAL_COMMUNITY): Payer: Self-pay

## 2019-11-01 ENCOUNTER — Encounter (HOSPITAL_COMMUNITY): Payer: Self-pay | Admitting: Emergency Medicine

## 2019-11-01 DIAGNOSIS — E1165 Type 2 diabetes mellitus with hyperglycemia: Secondary | ICD-10-CM | POA: Insufficient documentation

## 2019-11-01 DIAGNOSIS — R531 Weakness: Secondary | ICD-10-CM | POA: Diagnosis not present

## 2019-11-01 DIAGNOSIS — Z5321 Procedure and treatment not carried out due to patient leaving prior to being seen by health care provider: Secondary | ICD-10-CM | POA: Insufficient documentation

## 2019-11-01 DIAGNOSIS — R739 Hyperglycemia, unspecified: Secondary | ICD-10-CM

## 2019-11-01 HISTORY — DX: Acute embolism and thrombosis of unspecified deep veins of unspecified lower extremity: I82.409

## 2019-11-01 HISTORY — DX: Other pulmonary embolism without acute cor pulmonale: I26.99

## 2019-11-01 HISTORY — DX: Essential (primary) hypertension: I10

## 2019-11-01 LAB — URINALYSIS, ROUTINE W REFLEX MICROSCOPIC
Bacteria, UA: NONE SEEN
Bilirubin Urine: NEGATIVE
Glucose, UA: >=500 mg/dL — AB
Hgb urine dipstick: NEGATIVE
Ketones, ur: 5 mg/dL — AB
Leukocytes,Ua: NEGATIVE
Nitrite: NEGATIVE
Protein, ur: NEGATIVE mg/dL
Specific Gravity, Urine: 1.024 (ref 1.005–1.030)
pH: 6 (ref 5.0–8.0)

## 2019-11-01 LAB — CBC
HCT: 47.1 % (ref 39.0–52.0)
HCT: 46.2 % (ref 39.0–52.0)
Hemoglobin: 15.9 g/dL (ref 13.0–17.0)
Hemoglobin: 15.6 g/dL (ref 13.0–17.0)
MCH: 28 pg (ref 26.0–34.0)
MCH: 28.2 pg (ref 26.0–34.0)
MCHC: 33.8 g/dL (ref 30.0–36.0)
MCHC: 33.8 g/dL (ref 30.0–36.0)
MCV: 83.1 fL (ref 80.0–100.0)
MCV: 83.4 fL (ref 80.0–100.0)
Platelets: 248 10*3/uL (ref 150–400)
Platelets: 228 10*3/uL (ref 150–400)
RBC: 5.67 MIL/uL (ref 4.22–5.81)
RBC: 5.54 MIL/uL (ref 4.22–5.81)
RDW: 14.6 % (ref 11.5–15.5)
RDW: 14.6 % (ref 11.5–15.5)
WBC: 5.8 10*3/uL (ref 4.0–10.5)
WBC: 7.5 10*3/uL (ref 4.0–10.5)
nRBC: 0 % (ref 0.0–0.2)
nRBC: 0 % (ref 0.0–0.2)

## 2019-11-01 LAB — POCT URINALYSIS DIP (DEVICE)
Bilirubin Urine: NEGATIVE
Glucose, UA: >=1000 mg/dL — AB
Hgb urine dipstick: NEGATIVE
Ketones, ur: NEGATIVE mg/dL
Leukocytes,Ua: NEGATIVE
Nitrite: NEGATIVE
Protein, ur: NEGATIVE mg/dL
Specific Gravity, Urine: 1.01 (ref 1.005–1.030)
Urobilinogen, UA: 0.2 mg/dL (ref 0.0–1.0)
pH: 6.5 (ref 5.0–8.0)

## 2019-11-01 LAB — BASIC METABOLIC PANEL
Anion gap: 16 — ABNORMAL HIGH (ref 5–15)
BUN: 29 mg/dL — ABNORMAL HIGH (ref 8–23)
CO2: 24 mmol/L (ref 22–32)
Calcium: 9.7 mg/dL (ref 8.9–10.3)
Chloride: 91 mmol/L — ABNORMAL LOW (ref 98–111)
Creatinine, Ser: 2.23 mg/dL — ABNORMAL HIGH (ref 0.61–1.24)
GFR calc Af Amer: 33 mL/min — ABNORMAL LOW (ref >60)
GFR calc non Af Amer: 29 mL/min — ABNORMAL LOW (ref >60)
Glucose, Bld: 322 mg/dL — ABNORMAL HIGH (ref 70–99)
Potassium: 4.3 mmol/L (ref 3.5–5.1)
Sodium: 131 mmol/L — ABNORMAL LOW (ref 135–145)

## 2019-11-01 LAB — CBG MONITORING, ED
Glucose-Capillary: 384 mg/dL — ABNORMAL HIGH (ref 70–99)
Glucose-Capillary: 302 mg/dL — ABNORMAL HIGH (ref 70–99)

## 2019-11-01 LAB — COMPREHENSIVE METABOLIC PANEL
ALT: 39 U/L (ref 0–44)
AST: 24 U/L (ref 15–41)
Albumin: 4.1 g/dL (ref 3.5–5.0)
Alkaline Phosphatase: 73 U/L (ref 38–126)
Anion gap: 17 — ABNORMAL HIGH (ref 5–15)
BUN: 30 mg/dL — ABNORMAL HIGH (ref 8–23)
CO2: 22 mmol/L (ref 22–32)
Calcium: 9.8 mg/dL (ref 8.9–10.3)
Chloride: 91 mmol/L — ABNORMAL LOW (ref 98–111)
Creatinine, Ser: 1.87 mg/dL — ABNORMAL HIGH (ref 0.61–1.24)
GFR calc Af Amer: 41 mL/min — ABNORMAL LOW (ref >60)
GFR calc non Af Amer: 35 mL/min — ABNORMAL LOW (ref >60)
Glucose, Bld: 410 mg/dL — ABNORMAL HIGH (ref 70–99)
Potassium: 4.2 mmol/L (ref 3.5–5.1)
Sodium: 130 mmol/L — ABNORMAL LOW (ref 135–145)
Total Bilirubin: 0.9 mg/dL (ref 0.3–1.2)
Total Protein: 8.3 g/dL — ABNORMAL HIGH (ref 6.5–8.1)

## 2019-11-01 LAB — GLUCOSE, CAPILLARY: Glucose-Capillary: 384 mg/dL — ABNORMAL HIGH (ref 70–99)

## 2019-11-01 NOTE — ED Triage Notes (Signed)
Pt sent from [facility].  States his blood sugar was 460 this morning.  States he has been taking his diabetes medications as prescribed but they were changed approx 3-4 months ago.  Approx 22 lb weight loss since January with frequent urination.  Felt unsteady on feet x 1 week.

## 2019-11-01 NOTE — ED Provider Notes (Signed)
[facility]    CSN: [id number] Arrival date & time: 11/01/19  1131      History   Chief Complaint Chief Complaint  Patient presents with  . hyperglycemia    HPI Chris Burgess is a 74 y.o. male.   Chris Burgess presents with his wife with complaints of elevated blood sugar, feeling fatigued, as well as polyuria and polydipsia. Fatigue has been for the past week approximately, his wife has noticed him sleeping more. She feels like his eyes are less white than usual. This morning he finally checked his blood sugar and it was 470. Took his jardiance 25 mg and later was 370. He was switched from [place] a few months ago. History of kidney disease so has had other medications discontinued and changed in the past. He is also on coumadin related to history of dvt and pe. No fevers. No uri symptoms. Endorses taking his medications regularly. Initially he does not know his most recent hgba1c, but then later his wife states she thinks a home health nurse checked it and it was 10.7. he does follow with a PCP, but he has been out ill recently. No other nausea, vomiting or diarrhea. No change in mentation. No change in diet or exercise necessarily, although does not sound that he monitors his diet at all necessarily. States he also has started taking a "CB" pill of some type recently. He was supposed to get his second covid vaccine today but since his blood sugar was elevated they called a nurse hotline who then recommended he seek evaluation.     ROS per HPI, negative if not otherwise mentioned.      Past Medical History:  Diagnosis Date  . Diabetes mellitus   . DVT (deep venous thrombosis) (HCC)   . Hypertension   . Pulmonary embolism (HCC)     There are no problems to display for this patient.   History reviewed. No pertinent surgical history.     Home Medications    Prior to Admission medications   Medication Sig Start Date End Date Taking? Authorizing Provider   allopurinol (ZYLOPRIM) 300 MG tablet  05/25/19  Yes [provider]  amLODipine (NORVASC) 10 MG tablet  05/25/19  Yes [provider]  atorvastatin (LIPITOR) 20 MG tablet  06/19/19  Yes [provider]  JARDIANCE 25 MG TABS tablet  07/23/19  Yes [provider]  losartan (COZAAR) 100 MG tablet  05/25/19  Yes [provider]  pantoprazole (PROTONIX) 40 MG tablet Take 40 mg by mouth daily.   Yes [provider]  triamterene-hydrochlorothiazide (MAXZIDE-25) 37.5-25 MG tablet  05/25/19  Yes [provider]  warfarin (COUMADIN) 7.5 MG tablet  06/22/19  Yes [provider]    Family History Family History  Problem Relation Age of Onset  . Diabetes Father   . Hypertension Father   . Renal Disease Father     Social History Social History   Tobacco Use  . Smoking status: Never Smoker  . Smokeless tobacco: Never Used  Substance Use Topics  . Alcohol use: Yes    Comment: occasional  . Drug use: Yes    Types: Marijuana     Allergies   Patient has no known allergies.   Review of Systems Review of Systems   Physical Exam Triage Vital Signs ED Triage Vitals  Enc Vitals Group     BP 11/01/19 1150 113/69     Pulse Rate 11/01/19 1150 (!) 104  Resp 11/01/19 1150 18     Temp 11/01/19 1150 97.8 F (36.6 C)     Temp Source 11/01/19 1150 Oral     SpO2 11/01/19 1150 98 %     Weight --      Height --      Head Circumference --      Peak Flow --      Pain Score 11/01/19 1147 0     Pain Loc --      Pain Edu? --      Excl. in GC? --    No data found.  Updated Vital Signs BP 113/69 (BP Location: Right Arm)   Pulse (!) 104   Temp 97.8 F (36.6 C) (Oral)   Resp 18   SpO2 98%    Physical Exam Constitutional:      Appearance: He is well-developed.  Eyes:     Conjunctiva/sclera: Conjunctivae normal.     Pupils: Pupils are equal, round, and reactive to light.  Cardiovascular:     Rate and Rhythm:  Normal rate.  Pulmonary:     Effort: Pulmonary effort is normal.  Skin:    General: Skin is warm and dry.  Neurological:     Mental Status: He is alert and oriented to person, place, and time.      UC Treatments / Results  Labs (all labs ordered are listed, but only abnormal results are displayed) Labs Reviewed  COMPREHENSIVE METABOLIC PANEL - Abnormal; Notable for the following components:      Result Value   Sodium 130 (*)    Chloride 91 (*)    Glucose, Bld 410 (*)    BUN 30 (*)    Creatinine, Ser 1.87 (*)    Total Protein 8.3 (*)    GFR calc non Af Amer 35 (*)    GFR calc Af Amer 41 (*)    Anion gap 17 (*)    All other components within normal limits  GLUCOSE, CAPILLARY - Abnormal; Notable for the following components:   Glucose-Capillary 384 (*)    All other components within normal limits  CBG MONITORING, ED - Abnormal; Notable for the following components:   Glucose-Capillary 384 (*)    All other components within normal limits  POCT URINALYSIS DIP (DEVICE) - Abnormal; Notable for the following components:   Glucose, UA >=1000 (*)    All other components within normal limits  CBC    EKG   Radiology No results found.  Procedures Procedures (including critical care time)  Medications Ordered in UC Medications - No data to display  Initial Impression / Assessment and Plan / UC Course  I have reviewed the triage vital signs and the nursing notes.  Pertinent labs & imaging results that were available during my care of the patient were reviewed by me and considered in my medical decision making (see chart for details).     Blood sugar is 384 in clinic today, no ketones to urine. Unable to read notes from PCP or see previous labs, unfortunately. History of CKD and recently meds changed to jardiance. Uncertain what his baseline blood sugar tends to run as he hasn't been checking it until today and felt like this was high. Has had polyuria and polydipsia for  months now, but just recently feels more fatigued. No ketones to urine. Labs with hyponatremia, hyperglycemia, and obvious kidney dysfunction. Anion gap of 17 also. Patient called and notified of these results, he likely will benefit from fluids and insulin  at this point, recommend going to the ER for evaluation and treatment. Patient and wife verbalized understanding.  Final Clinical Impressions(s) / UC Diagnoses   Final diagnoses:  Hyperglycemia  Uncontrolled type 2 diabetes mellitus with hyperglycemia [facility])     Discharge Instructions     Please see provided information about diabetes and exercise and nutrition to further assist in managing your blood sugar.  Stay well hydrated.  While your blood sugar is elevated today you so far have not demonstrated any of the concerning lab findings which can be associated with this.  It is very important that you follow up with your primary care provider's office tomorrow for an appointment for recheck as you likely need medication changes for better control of your diabetes.  Don't hesitate to return for any worsening of symptoms.    ED Prescriptions    None     PDMP not reviewed this encounter.   Zigmund Gottron, NP 11/01/19 1414

## 2019-11-01 NOTE — ED Notes (Signed)
Pt came to tech desk asking how long the wait was going to be, this tech informed him of the longest wait time and process of ED. Pt asked if he could leave, pt was informed that it was his choice but is encouraged to stay.

## 2019-11-01 NOTE — Discharge Instructions (Signed)
Please see provided information about diabetes and exercise and nutrition to further assist in managing your blood sugar.  Stay well hydrated.  While your blood sugar is elevated today you so far have not demonstrated any of the concerning lab findings which can be associated with this.  It is very important that you follow up with your primary care provider's office tomorrow for an appointment for recheck as you likely need medication changes for better control of your diabetes.  Don't hesitate to return for any worsening of symptoms.

## 2019-11-01 NOTE — ED Triage Notes (Signed)
Pt c/o "feeling wobbly for about week". Pt states he checked his CBG this morning and it was 460, then took his DM medications and it decreased to 375.  Wife concerned pt has lost 22 lbs since January and urine frequency has increased. Denies dysuria sx or recent illness symptoms, n/v/d.  Pt AOx4 but slow to answer questions.  Had COVID vaccine approx 4 weeks ago; due for 2nd shot today.

## 2019-11-02 DIAGNOSIS — E1165 Type 2 diabetes mellitus with hyperglycemia: Secondary | ICD-10-CM | POA: Diagnosis not present

## 2019-11-04 DIAGNOSIS — E1165 Type 2 diabetes mellitus with hyperglycemia: Secondary | ICD-10-CM | POA: Diagnosis not present

## 2019-11-04 DIAGNOSIS — I10 Essential (primary) hypertension: Secondary | ICD-10-CM | POA: Diagnosis not present

## 2019-11-04 DIAGNOSIS — E785 Hyperlipidemia, unspecified: Secondary | ICD-10-CM | POA: Diagnosis not present

## 2019-11-04 DIAGNOSIS — N183 Chronic kidney disease, stage 3 unspecified: Secondary | ICD-10-CM | POA: Diagnosis not present

## 2019-11-25 DIAGNOSIS — E119 Type 2 diabetes mellitus without complications: Secondary | ICD-10-CM | POA: Diagnosis not present

## 2019-11-25 DIAGNOSIS — Z86718 Personal history of other venous thrombosis and embolism: Secondary | ICD-10-CM | POA: Diagnosis not present

## 2019-11-25 DIAGNOSIS — Z7901 Long term (current) use of anticoagulants: Secondary | ICD-10-CM | POA: Diagnosis not present

## 2019-12-16 DIAGNOSIS — N183 Chronic kidney disease, stage 3 unspecified: Secondary | ICD-10-CM | POA: Diagnosis not present

## 2019-12-16 DIAGNOSIS — I10 Essential (primary) hypertension: Secondary | ICD-10-CM | POA: Diagnosis not present

## 2019-12-16 DIAGNOSIS — E1165 Type 2 diabetes mellitus with hyperglycemia: Secondary | ICD-10-CM | POA: Diagnosis not present

## 2019-12-16 DIAGNOSIS — E785 Hyperlipidemia, unspecified: Secondary | ICD-10-CM | POA: Diagnosis not present

## 2019-12-24 DIAGNOSIS — Z86718 Personal history of other venous thrombosis and embolism: Secondary | ICD-10-CM | POA: Diagnosis not present

## 2019-12-24 DIAGNOSIS — Z7901 Long term (current) use of anticoagulants: Secondary | ICD-10-CM | POA: Diagnosis not present

## 2020-02-08 DIAGNOSIS — E1165 Type 2 diabetes mellitus with hyperglycemia: Secondary | ICD-10-CM | POA: Diagnosis not present

## 2020-02-15 DIAGNOSIS — E785 Hyperlipidemia, unspecified: Secondary | ICD-10-CM | POA: Diagnosis not present

## 2020-02-15 DIAGNOSIS — N183 Chronic kidney disease, stage 3 unspecified: Secondary | ICD-10-CM | POA: Diagnosis not present

## 2020-02-15 DIAGNOSIS — E1165 Type 2 diabetes mellitus with hyperglycemia: Secondary | ICD-10-CM | POA: Diagnosis not present

## 2020-02-15 DIAGNOSIS — I10 Essential (primary) hypertension: Secondary | ICD-10-CM | POA: Diagnosis not present

## 2020-03-02 DIAGNOSIS — Z86718 Personal history of other venous thrombosis and embolism: Secondary | ICD-10-CM | POA: Diagnosis not present

## 2020-03-02 DIAGNOSIS — Z7901 Long term (current) use of anticoagulants: Secondary | ICD-10-CM | POA: Diagnosis not present

## 2020-03-15 DIAGNOSIS — I10 Essential (primary) hypertension: Secondary | ICD-10-CM | POA: Diagnosis not present

## 2020-03-15 DIAGNOSIS — E1165 Type 2 diabetes mellitus with hyperglycemia: Secondary | ICD-10-CM | POA: Diagnosis not present

## 2020-03-15 DIAGNOSIS — E785 Hyperlipidemia, unspecified: Secondary | ICD-10-CM | POA: Diagnosis not present

## 2020-03-15 DIAGNOSIS — N183 Chronic kidney disease, stage 3 unspecified: Secondary | ICD-10-CM | POA: Diagnosis not present

## 2020-04-14 DIAGNOSIS — M1A0790 Idiopathic chronic gout, unspecified ankle and foot, without tophus (tophi): Secondary | ICD-10-CM | POA: Diagnosis not present

## 2020-04-14 DIAGNOSIS — E785 Hyperlipidemia, unspecified: Secondary | ICD-10-CM | POA: Diagnosis not present

## 2020-04-14 DIAGNOSIS — E559 Vitamin D deficiency, unspecified: Secondary | ICD-10-CM | POA: Diagnosis not present

## 2020-04-14 DIAGNOSIS — Z0000 Encounter for general adult medical examination without abnormal findings: Secondary | ICD-10-CM | POA: Diagnosis not present

## 2020-04-14 DIAGNOSIS — I10 Essential (primary) hypertension: Secondary | ICD-10-CM | POA: Diagnosis not present

## 2020-04-14 DIAGNOSIS — E139 Other specified diabetes mellitus without complications: Secondary | ICD-10-CM | POA: Diagnosis not present

## 2020-04-21 DIAGNOSIS — Z86718 Personal history of other venous thrombosis and embolism: Secondary | ICD-10-CM | POA: Diagnosis not present

## 2020-04-21 DIAGNOSIS — Z0000 Encounter for general adult medical examination without abnormal findings: Secondary | ICD-10-CM | POA: Diagnosis not present

## 2020-05-04 DIAGNOSIS — Z7901 Long term (current) use of anticoagulants: Secondary | ICD-10-CM | POA: Diagnosis not present

## 2020-05-04 DIAGNOSIS — Z86718 Personal history of other venous thrombosis and embolism: Secondary | ICD-10-CM | POA: Diagnosis not present

## 2020-05-17 DIAGNOSIS — N183 Chronic kidney disease, stage 3 unspecified: Secondary | ICD-10-CM | POA: Diagnosis not present

## 2020-05-17 DIAGNOSIS — E1165 Type 2 diabetes mellitus with hyperglycemia: Secondary | ICD-10-CM | POA: Diagnosis not present

## 2020-05-17 DIAGNOSIS — E785 Hyperlipidemia, unspecified: Secondary | ICD-10-CM | POA: Diagnosis not present

## 2020-05-17 DIAGNOSIS — E119 Type 2 diabetes mellitus without complications: Secondary | ICD-10-CM | POA: Diagnosis not present

## 2020-05-17 DIAGNOSIS — E1122 Type 2 diabetes mellitus with diabetic chronic kidney disease: Secondary | ICD-10-CM | POA: Diagnosis not present

## 2020-06-29 DIAGNOSIS — Z7901 Long term (current) use of anticoagulants: Secondary | ICD-10-CM | POA: Diagnosis not present

## 2020-06-29 DIAGNOSIS — Z86718 Personal history of other venous thrombosis and embolism: Secondary | ICD-10-CM | POA: Diagnosis not present

## 2020-08-11 DIAGNOSIS — I10 Essential (primary) hypertension: Secondary | ICD-10-CM | POA: Diagnosis not present

## 2020-08-11 DIAGNOSIS — Z7901 Long term (current) use of anticoagulants: Secondary | ICD-10-CM | POA: Diagnosis not present

## 2020-08-11 DIAGNOSIS — Z86718 Personal history of other venous thrombosis and embolism: Secondary | ICD-10-CM | POA: Diagnosis not present

## 2020-08-11 DIAGNOSIS — M109 Gout, unspecified: Secondary | ICD-10-CM | POA: Diagnosis not present

## 2020-09-13 DIAGNOSIS — Z86718 Personal history of other venous thrombosis and embolism: Secondary | ICD-10-CM | POA: Diagnosis not present

## 2020-09-13 DIAGNOSIS — Z7901 Long term (current) use of anticoagulants: Secondary | ICD-10-CM | POA: Diagnosis not present

## 2020-09-19 DIAGNOSIS — S0091XA Abrasion of unspecified part of head, initial encounter: Secondary | ICD-10-CM | POA: Diagnosis not present

## 2020-10-04 DIAGNOSIS — Z86718 Personal history of other venous thrombosis and embolism: Secondary | ICD-10-CM | POA: Diagnosis not present

## 2020-10-04 DIAGNOSIS — Z7901 Long term (current) use of anticoagulants: Secondary | ICD-10-CM | POA: Diagnosis not present

## 2020-10-04 DIAGNOSIS — E1165 Type 2 diabetes mellitus with hyperglycemia: Secondary | ICD-10-CM | POA: Diagnosis not present

## 2020-10-12 DIAGNOSIS — E7800 Pure hypercholesterolemia, unspecified: Secondary | ICD-10-CM | POA: Diagnosis not present

## 2020-10-12 DIAGNOSIS — Z125 Encounter for screening for malignant neoplasm of prostate: Secondary | ICD-10-CM | POA: Diagnosis not present

## 2020-10-12 DIAGNOSIS — R946 Abnormal results of thyroid function studies: Secondary | ICD-10-CM | POA: Diagnosis not present

## 2020-10-12 DIAGNOSIS — E559 Vitamin D deficiency, unspecified: Secondary | ICD-10-CM | POA: Diagnosis not present

## 2020-10-12 DIAGNOSIS — E1165 Type 2 diabetes mellitus with hyperglycemia: Secondary | ICD-10-CM | POA: Diagnosis not present

## 2020-10-12 DIAGNOSIS — I10 Essential (primary) hypertension: Secondary | ICD-10-CM | POA: Diagnosis not present

## 2020-10-19 DIAGNOSIS — Z7901 Long term (current) use of anticoagulants: Secondary | ICD-10-CM | POA: Diagnosis not present

## 2020-10-19 DIAGNOSIS — E7800 Pure hypercholesterolemia, unspecified: Secondary | ICD-10-CM | POA: Diagnosis not present

## 2020-10-19 DIAGNOSIS — E119 Type 2 diabetes mellitus without complications: Secondary | ICD-10-CM | POA: Diagnosis not present

## 2020-10-19 DIAGNOSIS — N183 Chronic kidney disease, stage 3 unspecified: Secondary | ICD-10-CM | POA: Diagnosis not present

## 2020-10-19 DIAGNOSIS — Z125 Encounter for screening for malignant neoplasm of prostate: Secondary | ICD-10-CM | POA: Diagnosis not present

## 2020-11-01 DIAGNOSIS — E118 Type 2 diabetes mellitus with unspecified complications: Secondary | ICD-10-CM | POA: Diagnosis not present

## 2020-11-01 DIAGNOSIS — Z86718 Personal history of other venous thrombosis and embolism: Secondary | ICD-10-CM | POA: Diagnosis not present

## 2020-11-01 DIAGNOSIS — Z7901 Long term (current) use of anticoagulants: Secondary | ICD-10-CM | POA: Diagnosis not present

## 2020-12-01 DIAGNOSIS — Z7901 Long term (current) use of anticoagulants: Secondary | ICD-10-CM | POA: Diagnosis not present

## 2020-12-01 DIAGNOSIS — Z86718 Personal history of other venous thrombosis and embolism: Secondary | ICD-10-CM | POA: Diagnosis not present

## 2020-12-01 DIAGNOSIS — E118 Type 2 diabetes mellitus with unspecified complications: Secondary | ICD-10-CM | POA: Diagnosis not present

## 2021-01-03 DIAGNOSIS — Z86718 Personal history of other venous thrombosis and embolism: Secondary | ICD-10-CM | POA: Diagnosis not present

## 2021-01-03 DIAGNOSIS — Z7901 Long term (current) use of anticoagulants: Secondary | ICD-10-CM | POA: Diagnosis not present

## 2021-01-10 DIAGNOSIS — E119 Type 2 diabetes mellitus without complications: Secondary | ICD-10-CM | POA: Diagnosis not present

## 2021-01-10 DIAGNOSIS — E785 Hyperlipidemia, unspecified: Secondary | ICD-10-CM | POA: Diagnosis not present

## 2021-01-10 DIAGNOSIS — Z7901 Long term (current) use of anticoagulants: Secondary | ICD-10-CM | POA: Diagnosis not present

## 2021-01-10 DIAGNOSIS — Z6831 Body mass index (BMI) 31.0-31.9, adult: Secondary | ICD-10-CM | POA: Diagnosis not present

## 2021-01-10 DIAGNOSIS — I10 Essential (primary) hypertension: Secondary | ICD-10-CM | POA: Diagnosis not present

## 2021-01-10 DIAGNOSIS — E118 Type 2 diabetes mellitus with unspecified complications: Secondary | ICD-10-CM | POA: Diagnosis not present

## 2021-01-10 DIAGNOSIS — Z86718 Personal history of other venous thrombosis and embolism: Secondary | ICD-10-CM | POA: Diagnosis not present

## 2021-01-10 DIAGNOSIS — N183 Chronic kidney disease, stage 3 unspecified: Secondary | ICD-10-CM | POA: Diagnosis not present

## 2021-01-10 DIAGNOSIS — E1122 Type 2 diabetes mellitus with diabetic chronic kidney disease: Secondary | ICD-10-CM | POA: Diagnosis not present

## 2021-02-09 DIAGNOSIS — Z86718 Personal history of other venous thrombosis and embolism: Secondary | ICD-10-CM | POA: Diagnosis not present

## 2021-02-09 DIAGNOSIS — Z7901 Long term (current) use of anticoagulants: Secondary | ICD-10-CM | POA: Diagnosis not present

## 2021-03-09 DIAGNOSIS — Z86718 Personal history of other venous thrombosis and embolism: Secondary | ICD-10-CM | POA: Diagnosis not present

## 2021-03-09 DIAGNOSIS — Z7901 Long term (current) use of anticoagulants: Secondary | ICD-10-CM | POA: Diagnosis not present

## 2021-04-06 DIAGNOSIS — Z7901 Long term (current) use of anticoagulants: Secondary | ICD-10-CM | POA: Diagnosis not present

## 2021-04-06 DIAGNOSIS — Z86718 Personal history of other venous thrombosis and embolism: Secondary | ICD-10-CM | POA: Diagnosis not present

## 2021-04-11 DIAGNOSIS — E119 Type 2 diabetes mellitus without complications: Secondary | ICD-10-CM | POA: Diagnosis not present

## 2021-04-11 DIAGNOSIS — M1990 Unspecified osteoarthritis, unspecified site: Secondary | ICD-10-CM | POA: Diagnosis not present

## 2021-04-11 DIAGNOSIS — E669 Obesity, unspecified: Secondary | ICD-10-CM | POA: Diagnosis not present

## 2021-04-11 DIAGNOSIS — E785 Hyperlipidemia, unspecified: Secondary | ICD-10-CM | POA: Diagnosis not present

## 2021-04-11 DIAGNOSIS — Z7901 Long term (current) use of anticoagulants: Secondary | ICD-10-CM | POA: Diagnosis not present

## 2021-04-11 DIAGNOSIS — M109 Gout, unspecified: Secondary | ICD-10-CM | POA: Diagnosis not present

## 2021-04-11 DIAGNOSIS — I10 Essential (primary) hypertension: Secondary | ICD-10-CM | POA: Diagnosis not present

## 2021-04-11 DIAGNOSIS — Z6831 Body mass index (BMI) 31.0-31.9, adult: Secondary | ICD-10-CM | POA: Diagnosis not present

## 2021-04-11 DIAGNOSIS — K219 Gastro-esophageal reflux disease without esophagitis: Secondary | ICD-10-CM | POA: Diagnosis not present

## 2021-04-20 DIAGNOSIS — E119 Type 2 diabetes mellitus without complications: Secondary | ICD-10-CM | POA: Diagnosis not present

## 2021-04-20 DIAGNOSIS — E7800 Pure hypercholesterolemia, unspecified: Secondary | ICD-10-CM | POA: Diagnosis not present

## 2021-04-20 DIAGNOSIS — I10 Essential (primary) hypertension: Secondary | ICD-10-CM | POA: Diagnosis not present

## 2021-04-20 DIAGNOSIS — Z125 Encounter for screening for malignant neoplasm of prostate: Secondary | ICD-10-CM | POA: Diagnosis not present

## 2021-04-20 DIAGNOSIS — R946 Abnormal results of thyroid function studies: Secondary | ICD-10-CM | POA: Diagnosis not present

## 2021-04-20 DIAGNOSIS — Z0000 Encounter for general adult medical examination without abnormal findings: Secondary | ICD-10-CM | POA: Diagnosis not present

## 2021-04-20 DIAGNOSIS — E559 Vitamin D deficiency, unspecified: Secondary | ICD-10-CM | POA: Diagnosis not present

## 2021-04-27 DIAGNOSIS — Z7901 Long term (current) use of anticoagulants: Secondary | ICD-10-CM | POA: Diagnosis not present

## 2021-04-27 DIAGNOSIS — E7800 Pure hypercholesterolemia, unspecified: Secondary | ICD-10-CM | POA: Diagnosis not present

## 2021-04-27 DIAGNOSIS — E1165 Type 2 diabetes mellitus with hyperglycemia: Secondary | ICD-10-CM | POA: Diagnosis not present

## 2021-04-27 DIAGNOSIS — Z0000 Encounter for general adult medical examination without abnormal findings: Secondary | ICD-10-CM | POA: Diagnosis not present

## 2021-04-27 DIAGNOSIS — I5189 Other ill-defined heart diseases: Secondary | ICD-10-CM | POA: Diagnosis not present

## 2021-04-27 DIAGNOSIS — K219 Gastro-esophageal reflux disease without esophagitis: Secondary | ICD-10-CM | POA: Diagnosis not present

## 2021-04-27 DIAGNOSIS — N183 Chronic kidney disease, stage 3 unspecified: Secondary | ICD-10-CM | POA: Diagnosis not present

## 2021-04-27 DIAGNOSIS — I10 Essential (primary) hypertension: Secondary | ICD-10-CM | POA: Diagnosis not present

## 2021-05-02 DIAGNOSIS — Z7901 Long term (current) use of anticoagulants: Secondary | ICD-10-CM | POA: Diagnosis not present

## 2021-05-02 DIAGNOSIS — Z86718 Personal history of other venous thrombosis and embolism: Secondary | ICD-10-CM | POA: Diagnosis not present

## 2021-05-31 DIAGNOSIS — Z86718 Personal history of other venous thrombosis and embolism: Secondary | ICD-10-CM | POA: Diagnosis not present

## 2021-05-31 DIAGNOSIS — Z7901 Long term (current) use of anticoagulants: Secondary | ICD-10-CM | POA: Diagnosis not present

## 2021-06-09 DIAGNOSIS — E119 Type 2 diabetes mellitus without complications: Secondary | ICD-10-CM | POA: Diagnosis not present

## 2021-07-05 DIAGNOSIS — Z86718 Personal history of other venous thrombosis and embolism: Secondary | ICD-10-CM | POA: Diagnosis not present

## 2021-07-05 DIAGNOSIS — Z7901 Long term (current) use of anticoagulants: Secondary | ICD-10-CM | POA: Diagnosis not present

## 2021-08-08 DIAGNOSIS — Z7901 Long term (current) use of anticoagulants: Secondary | ICD-10-CM | POA: Diagnosis not present

## 2021-08-08 DIAGNOSIS — Z86718 Personal history of other venous thrombosis and embolism: Secondary | ICD-10-CM | POA: Diagnosis not present

## 2021-09-12 DIAGNOSIS — Z86718 Personal history of other venous thrombosis and embolism: Secondary | ICD-10-CM | POA: Diagnosis not present

## 2021-09-12 DIAGNOSIS — E118 Type 2 diabetes mellitus with unspecified complications: Secondary | ICD-10-CM | POA: Diagnosis not present

## 2021-09-12 DIAGNOSIS — Z7901 Long term (current) use of anticoagulants: Secondary | ICD-10-CM | POA: Diagnosis not present

## 2021-10-17 DIAGNOSIS — E1165 Type 2 diabetes mellitus with hyperglycemia: Secondary | ICD-10-CM | POA: Diagnosis not present

## 2021-10-17 DIAGNOSIS — E7800 Pure hypercholesterolemia, unspecified: Secondary | ICD-10-CM | POA: Diagnosis not present

## 2021-10-19 DIAGNOSIS — Z86718 Personal history of other venous thrombosis and embolism: Secondary | ICD-10-CM | POA: Diagnosis not present

## 2021-10-19 DIAGNOSIS — Z7901 Long term (current) use of anticoagulants: Secondary | ICD-10-CM | POA: Diagnosis not present

## 2021-10-24 DIAGNOSIS — E1165 Type 2 diabetes mellitus with hyperglycemia: Secondary | ICD-10-CM | POA: Diagnosis not present

## 2021-10-24 DIAGNOSIS — Z86718 Personal history of other venous thrombosis and embolism: Secondary | ICD-10-CM | POA: Diagnosis not present

## 2021-10-24 DIAGNOSIS — M109 Gout, unspecified: Secondary | ICD-10-CM | POA: Diagnosis not present

## 2021-10-24 DIAGNOSIS — I10 Essential (primary) hypertension: Secondary | ICD-10-CM | POA: Diagnosis not present

## 2021-10-24 DIAGNOSIS — E559 Vitamin D deficiency, unspecified: Secondary | ICD-10-CM | POA: Diagnosis not present

## 2021-10-24 DIAGNOSIS — N183 Chronic kidney disease, stage 3 unspecified: Secondary | ICD-10-CM | POA: Diagnosis not present

## 2021-10-24 DIAGNOSIS — Z125 Encounter for screening for malignant neoplasm of prostate: Secondary | ICD-10-CM | POA: Diagnosis not present

## 2021-10-24 DIAGNOSIS — E7800 Pure hypercholesterolemia, unspecified: Secondary | ICD-10-CM | POA: Diagnosis not present

## 2021-11-28 DIAGNOSIS — Z86718 Personal history of other venous thrombosis and embolism: Secondary | ICD-10-CM | POA: Diagnosis not present

## 2021-11-28 DIAGNOSIS — Z7901 Long term (current) use of anticoagulants: Secondary | ICD-10-CM | POA: Diagnosis not present

## 2022-01-02 DIAGNOSIS — Z86718 Personal history of other venous thrombosis and embolism: Secondary | ICD-10-CM | POA: Diagnosis not present

## 2022-01-02 DIAGNOSIS — Z7901 Long term (current) use of anticoagulants: Secondary | ICD-10-CM | POA: Diagnosis not present

## 2022-01-16 DIAGNOSIS — K219 Gastro-esophageal reflux disease without esophagitis: Secondary | ICD-10-CM | POA: Diagnosis not present

## 2022-01-16 DIAGNOSIS — R109 Unspecified abdominal pain: Secondary | ICD-10-CM | POA: Diagnosis not present

## 2022-02-08 DIAGNOSIS — R799 Abnormal finding of blood chemistry, unspecified: Secondary | ICD-10-CM | POA: Diagnosis not present

## 2022-02-08 DIAGNOSIS — Z7901 Long term (current) use of anticoagulants: Secondary | ICD-10-CM | POA: Diagnosis not present

## 2022-02-08 DIAGNOSIS — N289 Disorder of kidney and ureter, unspecified: Secondary | ICD-10-CM | POA: Diagnosis not present

## 2022-02-08 DIAGNOSIS — Z86718 Personal history of other venous thrombosis and embolism: Secondary | ICD-10-CM | POA: Diagnosis not present

## 2022-02-08 DIAGNOSIS — R748 Abnormal levels of other serum enzymes: Secondary | ICD-10-CM | POA: Diagnosis not present

## 2022-02-22 DIAGNOSIS — R109 Unspecified abdominal pain: Secondary | ICD-10-CM | POA: Diagnosis not present

## 2022-02-22 DIAGNOSIS — R110 Nausea: Secondary | ICD-10-CM | POA: Diagnosis not present

## 2022-03-15 DIAGNOSIS — Z86718 Personal history of other venous thrombosis and embolism: Secondary | ICD-10-CM | POA: Diagnosis not present

## 2022-03-15 DIAGNOSIS — Z7901 Long term (current) use of anticoagulants: Secondary | ICD-10-CM | POA: Diagnosis not present

## 2022-04-24 DIAGNOSIS — Z125 Encounter for screening for malignant neoplasm of prostate: Secondary | ICD-10-CM | POA: Diagnosis not present

## 2022-04-24 DIAGNOSIS — E7800 Pure hypercholesterolemia, unspecified: Secondary | ICD-10-CM | POA: Diagnosis not present

## 2022-04-24 DIAGNOSIS — E1165 Type 2 diabetes mellitus with hyperglycemia: Secondary | ICD-10-CM | POA: Diagnosis not present

## 2022-04-24 DIAGNOSIS — E559 Vitamin D deficiency, unspecified: Secondary | ICD-10-CM | POA: Diagnosis not present

## 2022-04-24 DIAGNOSIS — M109 Gout, unspecified: Secondary | ICD-10-CM | POA: Diagnosis not present

## 2022-04-26 DIAGNOSIS — Z7901 Long term (current) use of anticoagulants: Secondary | ICD-10-CM | POA: Diagnosis not present

## 2022-04-26 DIAGNOSIS — Z86718 Personal history of other venous thrombosis and embolism: Secondary | ICD-10-CM | POA: Diagnosis not present

## 2022-05-01 DIAGNOSIS — Z23 Encounter for immunization: Secondary | ICD-10-CM | POA: Diagnosis not present

## 2022-05-01 DIAGNOSIS — Z0000 Encounter for general adult medical examination without abnormal findings: Secondary | ICD-10-CM | POA: Diagnosis not present

## 2022-05-01 DIAGNOSIS — K219 Gastro-esophageal reflux disease without esophagitis: Secondary | ICD-10-CM | POA: Diagnosis not present

## 2022-05-01 DIAGNOSIS — I10 Essential (primary) hypertension: Secondary | ICD-10-CM | POA: Diagnosis not present

## 2022-05-01 DIAGNOSIS — I5189 Other ill-defined heart diseases: Secondary | ICD-10-CM | POA: Diagnosis not present

## 2022-05-01 DIAGNOSIS — N1831 Chronic kidney disease, stage 3a: Secondary | ICD-10-CM | POA: Diagnosis not present

## 2022-05-01 DIAGNOSIS — E1122 Type 2 diabetes mellitus with diabetic chronic kidney disease: Secondary | ICD-10-CM | POA: Diagnosis not present

## 2022-05-01 DIAGNOSIS — E7800 Pure hypercholesterolemia, unspecified: Secondary | ICD-10-CM | POA: Diagnosis not present

## 2022-05-01 DIAGNOSIS — E1165 Type 2 diabetes mellitus with hyperglycemia: Secondary | ICD-10-CM | POA: Diagnosis not present

## 2022-05-01 DIAGNOSIS — N183 Chronic kidney disease, stage 3 unspecified: Secondary | ICD-10-CM | POA: Diagnosis not present

## 2022-05-01 DIAGNOSIS — Z7901 Long term (current) use of anticoagulants: Secondary | ICD-10-CM | POA: Diagnosis not present

## 2022-06-06 DIAGNOSIS — Z7901 Long term (current) use of anticoagulants: Secondary | ICD-10-CM | POA: Diagnosis not present

## 2022-06-06 DIAGNOSIS — Z86718 Personal history of other venous thrombosis and embolism: Secondary | ICD-10-CM | POA: Diagnosis not present

## 2022-07-12 DIAGNOSIS — E1165 Type 2 diabetes mellitus with hyperglycemia: Secondary | ICD-10-CM | POA: Diagnosis not present

## 2022-07-12 DIAGNOSIS — Z7901 Long term (current) use of anticoagulants: Secondary | ICD-10-CM | POA: Diagnosis not present

## 2022-07-12 DIAGNOSIS — Z86718 Personal history of other venous thrombosis and embolism: Secondary | ICD-10-CM | POA: Diagnosis not present

## 2022-07-31 DIAGNOSIS — E7800 Pure hypercholesterolemia, unspecified: Secondary | ICD-10-CM | POA: Diagnosis not present

## 2022-07-31 DIAGNOSIS — E1165 Type 2 diabetes mellitus with hyperglycemia: Secondary | ICD-10-CM | POA: Diagnosis not present

## 2022-08-09 DIAGNOSIS — E1165 Type 2 diabetes mellitus with hyperglycemia: Secondary | ICD-10-CM | POA: Diagnosis not present

## 2022-08-09 DIAGNOSIS — K219 Gastro-esophageal reflux disease without esophagitis: Secondary | ICD-10-CM | POA: Diagnosis not present

## 2022-08-09 DIAGNOSIS — I5189 Other ill-defined heart diseases: Secondary | ICD-10-CM | POA: Diagnosis not present

## 2022-08-09 DIAGNOSIS — E1122 Type 2 diabetes mellitus with diabetic chronic kidney disease: Secondary | ICD-10-CM | POA: Diagnosis not present

## 2022-08-09 DIAGNOSIS — I10 Essential (primary) hypertension: Secondary | ICD-10-CM | POA: Diagnosis not present

## 2022-08-09 DIAGNOSIS — N183 Chronic kidney disease, stage 3 unspecified: Secondary | ICD-10-CM | POA: Diagnosis not present

## 2022-08-09 DIAGNOSIS — Z7901 Long term (current) use of anticoagulants: Secondary | ICD-10-CM | POA: Diagnosis not present

## 2022-08-09 DIAGNOSIS — E7800 Pure hypercholesterolemia, unspecified: Secondary | ICD-10-CM | POA: Diagnosis not present

## 2022-08-09 DIAGNOSIS — E875 Hyperkalemia: Secondary | ICD-10-CM | POA: Diagnosis not present

## 2022-08-16 DIAGNOSIS — Z86718 Personal history of other venous thrombosis and embolism: Secondary | ICD-10-CM | POA: Diagnosis not present

## 2022-08-16 DIAGNOSIS — Z7901 Long term (current) use of anticoagulants: Secondary | ICD-10-CM | POA: Diagnosis not present

## 2022-08-23 DIAGNOSIS — E875 Hyperkalemia: Secondary | ICD-10-CM | POA: Diagnosis not present

## 2022-10-03 DIAGNOSIS — Z86718 Personal history of other venous thrombosis and embolism: Secondary | ICD-10-CM | POA: Diagnosis not present

## 2022-10-03 DIAGNOSIS — Z7901 Long term (current) use of anticoagulants: Secondary | ICD-10-CM | POA: Diagnosis not present

## 2022-10-17 DIAGNOSIS — Z86718 Personal history of other venous thrombosis and embolism: Secondary | ICD-10-CM | POA: Diagnosis not present

## 2022-10-17 DIAGNOSIS — Z7901 Long term (current) use of anticoagulants: Secondary | ICD-10-CM | POA: Diagnosis not present

## 2022-11-14 DIAGNOSIS — Z7901 Long term (current) use of anticoagulants: Secondary | ICD-10-CM | POA: Diagnosis not present

## 2022-11-14 DIAGNOSIS — Z86718 Personal history of other venous thrombosis and embolism: Secondary | ICD-10-CM | POA: Diagnosis not present

## 2022-12-13 DIAGNOSIS — Z86718 Personal history of other venous thrombosis and embolism: Secondary | ICD-10-CM | POA: Diagnosis not present

## 2022-12-13 DIAGNOSIS — Z7901 Long term (current) use of anticoagulants: Secondary | ICD-10-CM | POA: Diagnosis not present

## 2023-01-10 DIAGNOSIS — E119 Type 2 diabetes mellitus without complications: Secondary | ICD-10-CM | POA: Diagnosis not present

## 2023-01-10 DIAGNOSIS — Z7901 Long term (current) use of anticoagulants: Secondary | ICD-10-CM | POA: Diagnosis not present

## 2023-01-10 DIAGNOSIS — I10 Essential (primary) hypertension: Secondary | ICD-10-CM | POA: Diagnosis not present

## 2023-01-10 DIAGNOSIS — Z86718 Personal history of other venous thrombosis and embolism: Secondary | ICD-10-CM | POA: Diagnosis not present

## 2023-02-07 DIAGNOSIS — I10 Essential (primary) hypertension: Secondary | ICD-10-CM | POA: Diagnosis not present

## 2023-02-07 DIAGNOSIS — E7800 Pure hypercholesterolemia, unspecified: Secondary | ICD-10-CM | POA: Diagnosis not present

## 2023-02-07 DIAGNOSIS — E1165 Type 2 diabetes mellitus with hyperglycemia: Secondary | ICD-10-CM | POA: Diagnosis not present

## 2023-02-12 DIAGNOSIS — Z7901 Long term (current) use of anticoagulants: Secondary | ICD-10-CM | POA: Diagnosis not present

## 2023-02-12 DIAGNOSIS — Z86718 Personal history of other venous thrombosis and embolism: Secondary | ICD-10-CM | POA: Diagnosis not present

## 2023-02-12 DIAGNOSIS — I10 Essential (primary) hypertension: Secondary | ICD-10-CM | POA: Diagnosis not present

## 2023-02-14 DIAGNOSIS — I5189 Other ill-defined heart diseases: Secondary | ICD-10-CM | POA: Diagnosis not present

## 2023-02-14 DIAGNOSIS — N183 Chronic kidney disease, stage 3 unspecified: Secondary | ICD-10-CM | POA: Diagnosis not present

## 2023-02-14 DIAGNOSIS — E1165 Type 2 diabetes mellitus with hyperglycemia: Secondary | ICD-10-CM | POA: Diagnosis not present

## 2023-02-14 DIAGNOSIS — K219 Gastro-esophageal reflux disease without esophagitis: Secondary | ICD-10-CM | POA: Diagnosis not present

## 2023-02-14 DIAGNOSIS — I10 Essential (primary) hypertension: Secondary | ICD-10-CM | POA: Diagnosis not present

## 2023-02-14 DIAGNOSIS — Z7901 Long term (current) use of anticoagulants: Secondary | ICD-10-CM | POA: Diagnosis not present

## 2023-02-14 DIAGNOSIS — E875 Hyperkalemia: Secondary | ICD-10-CM | POA: Diagnosis not present

## 2023-02-14 DIAGNOSIS — E7800 Pure hypercholesterolemia, unspecified: Secondary | ICD-10-CM | POA: Diagnosis not present

## 2023-04-30 DIAGNOSIS — Z7901 Long term (current) use of anticoagulants: Secondary | ICD-10-CM | POA: Diagnosis not present

## 2023-04-30 DIAGNOSIS — I10 Essential (primary) hypertension: Secondary | ICD-10-CM | POA: Diagnosis not present

## 2023-04-30 DIAGNOSIS — Z86718 Personal history of other venous thrombosis and embolism: Secondary | ICD-10-CM | POA: Diagnosis not present

## 2023-05-09 DIAGNOSIS — E1165 Type 2 diabetes mellitus with hyperglycemia: Secondary | ICD-10-CM | POA: Diagnosis not present

## 2023-05-09 DIAGNOSIS — I10 Essential (primary) hypertension: Secondary | ICD-10-CM | POA: Diagnosis not present

## 2023-05-09 DIAGNOSIS — N289 Disorder of kidney and ureter, unspecified: Secondary | ICD-10-CM | POA: Diagnosis not present

## 2023-05-09 DIAGNOSIS — Z0000 Encounter for general adult medical examination without abnormal findings: Secondary | ICD-10-CM | POA: Diagnosis not present

## 2023-05-09 DIAGNOSIS — E7800 Pure hypercholesterolemia, unspecified: Secondary | ICD-10-CM | POA: Diagnosis not present

## 2023-05-16 DIAGNOSIS — K219 Gastro-esophageal reflux disease without esophagitis: Secondary | ICD-10-CM | POA: Diagnosis not present

## 2023-05-16 DIAGNOSIS — I5189 Other ill-defined heart diseases: Secondary | ICD-10-CM | POA: Diagnosis not present

## 2023-05-16 DIAGNOSIS — E7800 Pure hypercholesterolemia, unspecified: Secondary | ICD-10-CM | POA: Diagnosis not present

## 2023-05-16 DIAGNOSIS — H9311 Tinnitus, right ear: Secondary | ICD-10-CM | POA: Diagnosis not present

## 2023-05-16 DIAGNOSIS — E1165 Type 2 diabetes mellitus with hyperglycemia: Secondary | ICD-10-CM | POA: Diagnosis not present

## 2023-05-16 DIAGNOSIS — N183 Chronic kidney disease, stage 3 unspecified: Secondary | ICD-10-CM | POA: Diagnosis not present

## 2023-05-16 DIAGNOSIS — Z7901 Long term (current) use of anticoagulants: Secondary | ICD-10-CM | POA: Diagnosis not present

## 2023-05-16 DIAGNOSIS — Z0000 Encounter for general adult medical examination without abnormal findings: Secondary | ICD-10-CM | POA: Diagnosis not present

## 2023-05-16 DIAGNOSIS — I10 Essential (primary) hypertension: Secondary | ICD-10-CM | POA: Diagnosis not present

## 2023-05-30 DIAGNOSIS — E1165 Type 2 diabetes mellitus with hyperglycemia: Secondary | ICD-10-CM | POA: Diagnosis not present

## 2023-05-30 DIAGNOSIS — R011 Cardiac murmur, unspecified: Secondary | ICD-10-CM | POA: Diagnosis not present

## 2023-05-30 DIAGNOSIS — K219 Gastro-esophageal reflux disease without esophagitis: Secondary | ICD-10-CM | POA: Diagnosis not present

## 2023-05-30 DIAGNOSIS — I5189 Other ill-defined heart diseases: Secondary | ICD-10-CM | POA: Diagnosis not present

## 2023-05-30 DIAGNOSIS — Z7901 Long term (current) use of anticoagulants: Secondary | ICD-10-CM | POA: Diagnosis not present

## 2023-05-30 DIAGNOSIS — N183 Chronic kidney disease, stage 3 unspecified: Secondary | ICD-10-CM | POA: Diagnosis not present

## 2023-05-30 DIAGNOSIS — I10 Essential (primary) hypertension: Secondary | ICD-10-CM | POA: Diagnosis not present

## 2023-05-30 DIAGNOSIS — E7800 Pure hypercholesterolemia, unspecified: Secondary | ICD-10-CM | POA: Diagnosis not present

## 2023-06-04 DIAGNOSIS — Z86718 Personal history of other venous thrombosis and embolism: Secondary | ICD-10-CM | POA: Diagnosis not present

## 2023-06-04 DIAGNOSIS — I10 Essential (primary) hypertension: Secondary | ICD-10-CM | POA: Diagnosis not present

## 2023-06-04 DIAGNOSIS — Z7901 Long term (current) use of anticoagulants: Secondary | ICD-10-CM | POA: Diagnosis not present

## 2023-06-12 DIAGNOSIS — R011 Cardiac murmur, unspecified: Secondary | ICD-10-CM | POA: Diagnosis not present

## 2023-06-12 DIAGNOSIS — I5189 Other ill-defined heart diseases: Secondary | ICD-10-CM | POA: Diagnosis not present

## 2023-06-25 DIAGNOSIS — Z7901 Long term (current) use of anticoagulants: Secondary | ICD-10-CM | POA: Diagnosis not present

## 2023-06-25 DIAGNOSIS — I10 Essential (primary) hypertension: Secondary | ICD-10-CM | POA: Diagnosis not present

## 2023-06-25 DIAGNOSIS — Z86718 Personal history of other venous thrombosis and embolism: Secondary | ICD-10-CM | POA: Diagnosis not present

## 2023-09-11 ENCOUNTER — Ambulatory Visit: Payer: Medicare HMO | Admitting: Cardiovascular Disease

## 2023-11-06 ENCOUNTER — Ambulatory Visit: Payer: Medicare HMO | Attending: Cardiovascular Disease | Admitting: Cardiovascular Disease

## 2023-11-21 DIAGNOSIS — E1165 Type 2 diabetes mellitus with hyperglycemia: Secondary | ICD-10-CM | POA: Diagnosis not present

## 2023-11-21 DIAGNOSIS — I10 Essential (primary) hypertension: Secondary | ICD-10-CM | POA: Diagnosis not present

## 2023-11-21 DIAGNOSIS — E7800 Pure hypercholesterolemia, unspecified: Secondary | ICD-10-CM | POA: Diagnosis not present

## 2023-11-28 DIAGNOSIS — N183 Chronic kidney disease, stage 3 unspecified: Secondary | ICD-10-CM | POA: Diagnosis not present

## 2023-11-28 DIAGNOSIS — K219 Gastro-esophageal reflux disease without esophagitis: Secondary | ICD-10-CM | POA: Diagnosis not present

## 2023-11-28 DIAGNOSIS — E7800 Pure hypercholesterolemia, unspecified: Secondary | ICD-10-CM | POA: Diagnosis not present

## 2023-11-28 DIAGNOSIS — Z7901 Long term (current) use of anticoagulants: Secondary | ICD-10-CM | POA: Diagnosis not present

## 2023-11-28 DIAGNOSIS — I5189 Other ill-defined heart diseases: Secondary | ICD-10-CM | POA: Diagnosis not present

## 2023-11-28 DIAGNOSIS — E1165 Type 2 diabetes mellitus with hyperglycemia: Secondary | ICD-10-CM | POA: Diagnosis not present

## 2023-11-28 DIAGNOSIS — I10 Essential (primary) hypertension: Secondary | ICD-10-CM | POA: Diagnosis not present

## 2023-12-05 DIAGNOSIS — Z86718 Personal history of other venous thrombosis and embolism: Secondary | ICD-10-CM | POA: Diagnosis not present

## 2023-12-05 DIAGNOSIS — N5089 Other specified disorders of the male genital organs: Secondary | ICD-10-CM | POA: Diagnosis not present

## 2023-12-05 DIAGNOSIS — M79603 Pain in arm, unspecified: Secondary | ICD-10-CM | POA: Diagnosis not present

## 2023-12-05 DIAGNOSIS — E1122 Type 2 diabetes mellitus with diabetic chronic kidney disease: Secondary | ICD-10-CM | POA: Diagnosis not present

## 2024-03-04 DIAGNOSIS — E119 Type 2 diabetes mellitus without complications: Secondary | ICD-10-CM | POA: Diagnosis not present

## 2024-03-06 DIAGNOSIS — H524 Presbyopia: Secondary | ICD-10-CM | POA: Diagnosis not present

## 2024-03-06 DIAGNOSIS — H52222 Regular astigmatism, left eye: Secondary | ICD-10-CM | POA: Diagnosis not present

## 2024-05-11 DIAGNOSIS — I10 Essential (primary) hypertension: Secondary | ICD-10-CM | POA: Diagnosis not present

## 2024-05-11 DIAGNOSIS — N289 Disorder of kidney and ureter, unspecified: Secondary | ICD-10-CM | POA: Diagnosis not present

## 2024-05-11 DIAGNOSIS — E1165 Type 2 diabetes mellitus with hyperglycemia: Secondary | ICD-10-CM | POA: Diagnosis not present

## 2024-05-11 DIAGNOSIS — E7800 Pure hypercholesterolemia, unspecified: Secondary | ICD-10-CM | POA: Diagnosis not present

## 2024-05-18 DIAGNOSIS — K219 Gastro-esophageal reflux disease without esophagitis: Secondary | ICD-10-CM | POA: Diagnosis not present

## 2024-05-18 DIAGNOSIS — E7800 Pure hypercholesterolemia, unspecified: Secondary | ICD-10-CM | POA: Diagnosis not present

## 2024-05-18 DIAGNOSIS — E1165 Type 2 diabetes mellitus with hyperglycemia: Secondary | ICD-10-CM | POA: Diagnosis not present

## 2024-05-18 DIAGNOSIS — N183 Chronic kidney disease, stage 3 unspecified: Secondary | ICD-10-CM | POA: Diagnosis not present

## 2024-05-18 DIAGNOSIS — I10 Essential (primary) hypertension: Secondary | ICD-10-CM | POA: Diagnosis not present

## 2024-05-18 DIAGNOSIS — Z86718 Personal history of other venous thrombosis and embolism: Secondary | ICD-10-CM | POA: Diagnosis not present

## 2024-05-18 DIAGNOSIS — Z7901 Long term (current) use of anticoagulants: Secondary | ICD-10-CM | POA: Diagnosis not present

## 2024-05-18 DIAGNOSIS — Z0000 Encounter for general adult medical examination without abnormal findings: Secondary | ICD-10-CM | POA: Diagnosis not present

## 2024-05-18 DIAGNOSIS — I5189 Other ill-defined heart diseases: Secondary | ICD-10-CM | POA: Diagnosis not present
